# Patient Record
Sex: Female | Born: 1955 | Race: White | Hispanic: No | Marital: Married | Smoking: Never smoker
Health system: Southern US, Community
[De-identification: ages and names within clinical notes are randomized; demographics above are authoritative.]

## PROBLEM LIST (undated history)

## (undated) DIAGNOSIS — K5792 Diverticulitis of intestine, part unspecified, without perforation or abscess without bleeding: Secondary | ICD-10-CM

## (undated) DIAGNOSIS — K589 Irritable bowel syndrome without diarrhea: Secondary | ICD-10-CM

## (undated) DIAGNOSIS — L0591 Pilonidal cyst without abscess: Secondary | ICD-10-CM

## (undated) DIAGNOSIS — E785 Hyperlipidemia, unspecified: Secondary | ICD-10-CM

## (undated) DIAGNOSIS — T7840XA Allergy, unspecified, initial encounter: Secondary | ICD-10-CM

## (undated) DIAGNOSIS — M1990 Unspecified osteoarthritis, unspecified site: Secondary | ICD-10-CM

## (undated) DIAGNOSIS — K8020 Calculus of gallbladder without cholecystitis without obstruction: Secondary | ICD-10-CM

## (undated) DIAGNOSIS — T8859XA Other complications of anesthesia, initial encounter: Secondary | ICD-10-CM

## (undated) DIAGNOSIS — R112 Nausea with vomiting, unspecified: Secondary | ICD-10-CM

## (undated) DIAGNOSIS — T4145XA Adverse effect of unspecified anesthetic, initial encounter: Secondary | ICD-10-CM

## (undated) DIAGNOSIS — N200 Calculus of kidney: Secondary | ICD-10-CM

## (undated) DIAGNOSIS — K5790 Diverticulosis of intestine, part unspecified, without perforation or abscess without bleeding: Secondary | ICD-10-CM

## (undated) DIAGNOSIS — Z87442 Personal history of urinary calculi: Secondary | ICD-10-CM

## (undated) DIAGNOSIS — Z9889 Other specified postprocedural states: Secondary | ICD-10-CM

## (undated) HISTORY — PX: FRACTURE SURGERY: SHX138

## (undated) HISTORY — DX: Diverticulosis of intestine, part unspecified, without perforation or abscess without bleeding: K57.90

## (undated) HISTORY — DX: Allergy, unspecified, initial encounter: T78.40XA

## (undated) HISTORY — PX: APPENDECTOMY: SHX54

## (undated) HISTORY — DX: Unspecified osteoarthritis, unspecified site: M19.90

## (undated) HISTORY — DX: Pilonidal cyst without abscess: L05.91

## (undated) HISTORY — DX: Diverticulitis of intestine, part unspecified, without perforation or abscess without bleeding: K57.92

## (undated) HISTORY — DX: Hyperlipidemia, unspecified: E78.5

## (undated) HISTORY — DX: Calculus of kidney: N20.0

## (undated) HISTORY — DX: Irritable bowel syndrome, unspecified: K58.9

## (undated) HISTORY — DX: Calculus of gallbladder without cholecystitis without obstruction: K80.20

## (undated) HISTORY — PX: ABDOMINAL HYSTERECTOMY: SHX81

---
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
## 2013-05-07 DIAGNOSIS — J3089 Other allergic rhinitis: Secondary | ICD-10-CM | POA: Insufficient documentation

## 2014-01-05 ENCOUNTER — Encounter: Payer: Self-pay | Admitting: Internal Medicine

## 2014-01-29 ENCOUNTER — Encounter: Payer: Self-pay | Admitting: Internal Medicine

## 2014-01-29 ENCOUNTER — Ambulatory Visit (INDEPENDENT_AMBULATORY_CARE_PROVIDER_SITE_OTHER): Payer: Federal, State, Local not specified - PPO | Admitting: Internal Medicine

## 2014-01-29 ENCOUNTER — Other Ambulatory Visit: Payer: Self-pay

## 2014-01-29 ENCOUNTER — Telehealth: Payer: Self-pay | Admitting: Internal Medicine

## 2014-01-29 VITALS — BP 102/68 | HR 68 | Ht 60.0 in | Wt 123.2 lb

## 2014-01-29 DIAGNOSIS — R143 Flatulence: Secondary | ICD-10-CM

## 2014-01-29 DIAGNOSIS — K589 Irritable bowel syndrome without diarrhea: Secondary | ICD-10-CM

## 2014-01-29 DIAGNOSIS — R142 Eructation: Secondary | ICD-10-CM

## 2014-01-29 DIAGNOSIS — K5732 Diverticulitis of large intestine without perforation or abscess without bleeding: Secondary | ICD-10-CM

## 2014-01-29 DIAGNOSIS — K5792 Diverticulitis of intestine, part unspecified, without perforation or abscess without bleeding: Secondary | ICD-10-CM

## 2014-01-29 DIAGNOSIS — R141 Gas pain: Secondary | ICD-10-CM

## 2014-01-29 MED ORDER — PANTOPRAZOLE SODIUM 40 MG PO TBEC: 40.0000 mg | DELAYED_RELEASE_TABLET | Freq: Every day | ORAL | Status: DC

## 2014-01-29 MED ORDER — ALIGN PO CAPS: 1.0000 | ORAL_CAPSULE | Freq: Every day | ORAL | Status: DC

## 2014-01-29 NOTE — Patient Instructions (Addendum)
You may purchase Align over the counter. This puts good bacteria back into your colon. You should take 1 capsule by mouth once daily for 4 weeks.  You may take Miralax as needed for constipation.  We have given you some information on diverticulitis and Irritable Bowel Syndrome  Please follow up with Dr. Henrene Pastor in 3 months

## 2014-01-29 NOTE — Telephone Encounter (Signed)
Spoke with pt and told her I would send rx for Pantoprazole to her pharmacy

## 2014-01-29 NOTE — Progress Notes (Addendum)
HISTORY OF PRESENT ILLNESS:  Stephanie Hoover is a 58 y.o. female self-referred regarding abdominal complaints. The patient lives in [place], has her PCP in [place], but currently works in [place]. She stays with the [facility], patients of mine, during the week. No significant outside records available. Records have been requested. In any event, she tells me that she underwent colonoscopy at age 41. Told that she had diverticulosis. Around that time had a bout of diverticulitis and apparently underwent a second colonoscopy. She denies having had polyps.. The diagnosis of irritable bowel syndrome. In early January 2015 she developed lower abdominal pain which progressed. Apparently evaluated at her local [facility] and was diagnosed with diverticulitis and UTI. Treated with Flagyl and Levaquin. Had problems with epigastric burning discomfort for which she was placed on pantoprazole. Thanks of finally settled down. She does have alternating bowel habits. More problems recently with constipation. Also complains of increased intestinal gas. No bleeding or weight loss.  REVIEW OF SYSTEMS:  All non-GI ROS negative except for back pain, arthritis, sinus and allergy, insomnia  Past Medical History  Diagnosis Date  . Arthritis   . Diverticular disease   . Hyperlipidemia     Past Surgical History  Procedure Laterality Date  . Appendectomy    . Abdominal hysterectomy      Social History Stephanie Hoover  reports that she has never smoked. She has never used smokeless tobacco. She reports that she drinks alcohol. She reports that she does not use illicit drugs.  family history is not on file.  Allergies  Allergen Reactions  . Codeine Nausea And Vomiting  . Demerol [Meperidine] Nausea And Vomiting  . Phenergan Vc [Promethazine-Phenylephrine]        PHYSICAL EXAMINATION: Vital signs: BP 102/68  Pulse 68  Ht 5' (1.524 m)  Wt 123 lb 3.2 oz (55.883 kg)  BMI 24.06 kg/m2  Constitutional:  generally well-appearing, no acute distress Psychiatric: alert and oriented x3, cooperative Eyes: extraocular movements intact, anicteric, conjunctiva pink Mouth: oral pharynx moist, no lesions Neck: supple no lymphadenopathy Cardiovascular: heart regular rate and rhythm, no murmur Lungs: clear to auscultation bilaterally Abdomen: soft, nontender, nondistended, no obvious ascites, no peritoneal signs, normal bowel sounds, no organomegaly Extremities: no lower extremity edema bilaterally Skin: no lesions on visible extremities Neuro: No focal deficits. No asterixis.    ASSESSMENT:  #1. Apparent recent bout of diverticulitis. Resolved after antibiotics #2. IBS #3. Increased intestinal gas #4. Recent UTI #5. Reports of colonoscopy x2 at age 16. Diverticulosis   PLAN:  #1. Probiotic align one daily for 4 weeks #2. MiraLax as needed for constipation #3. Okay to use PPI if this helps burning discomfort. We are happy to refill as needed. #4. Literature on IBS #5. Literature on diverticulosis #6. Literature on increased intestinal gas #7. Obtaining outside records for review. GI followup in 3 months    ADDENDUM (02/01/2014)  Outside records obtained and reviewed. The patient had complete colonoscopy 08/11/2007 to evaluate anorectal pain and chronic diarrhea. Examination revealed sigmoid diverticulosis. A repeat complete colonoscopy was performed 07/09/2008 for left lower cautery pain and abnormal CT scan. Examination again revealed sigmoid diverticulosis. Also, CT scan of the abdomen and pelvis with contrast 12/27/2013 revealed acute diverticulitis versus sigmoid colitis and nonspecific wall thickening with stranding of the duodenum and jejunum. Also, incidental cholelithiasis.

## 2014-02-05 ENCOUNTER — Telehealth: Payer: Self-pay | Admitting: Internal Medicine

## 2014-02-05 NOTE — Telephone Encounter (Signed)
Rec'd from [place] forward 10 pages to Dr.Perry

## 2014-06-08 ENCOUNTER — Ambulatory Visit: Payer: Federal, State, Local not specified - PPO | Admitting: Internal Medicine

## 2014-07-13 ENCOUNTER — Ambulatory Visit (INDEPENDENT_AMBULATORY_CARE_PROVIDER_SITE_OTHER): Payer: Federal, State, Local not specified - PPO | Admitting: Internal Medicine

## 2014-07-13 ENCOUNTER — Encounter: Payer: Self-pay | Admitting: Internal Medicine

## 2014-07-13 ENCOUNTER — Other Ambulatory Visit (INDEPENDENT_AMBULATORY_CARE_PROVIDER_SITE_OTHER): Payer: Federal, State, Local not specified - PPO

## 2014-07-13 VITALS — BP 100/68 | HR 72 | Ht 60.0 in | Wt 129.0 lb

## 2014-07-13 DIAGNOSIS — R300 Dysuria: Secondary | ICD-10-CM

## 2014-07-13 DIAGNOSIS — R5381 Other malaise: Secondary | ICD-10-CM

## 2014-07-13 DIAGNOSIS — K5733 Diverticulitis of large intestine without perforation or abscess with bleeding: Secondary | ICD-10-CM

## 2014-07-13 DIAGNOSIS — R143 Flatulence: Secondary | ICD-10-CM

## 2014-07-13 DIAGNOSIS — K589 Irritable bowel syndrome without diarrhea: Secondary | ICD-10-CM

## 2014-07-13 DIAGNOSIS — R142 Eructation: Secondary | ICD-10-CM

## 2014-07-13 DIAGNOSIS — K5793 Diverticulitis of intestine, part unspecified, without perforation or abscess with bleeding: Secondary | ICD-10-CM

## 2014-07-13 DIAGNOSIS — R5383 Other fatigue: Principal | ICD-10-CM

## 2014-07-13 DIAGNOSIS — R141 Gas pain: Secondary | ICD-10-CM

## 2014-07-13 LAB — URINALYSIS, ROUTINE W REFLEX MICROSCOPIC
Bilirubin Urine: NEGATIVE
Hgb urine dipstick: NEGATIVE
Ketones, ur: NEGATIVE
Leukocytes, UA: NEGATIVE
Nitrite: NEGATIVE
Specific Gravity, Urine: 1.015 (ref 1.000–1.030)
Total Protein, Urine: NEGATIVE
Urine Glucose: NEGATIVE
Urobilinogen, UA: 0.2 (ref 0.0–1.0)
pH: 7 (ref 5.0–8.0)

## 2014-07-13 LAB — CBC WITH DIFFERENTIAL/PLATELET
Basophils Absolute: 0 10*3/uL (ref 0.0–0.1)
Basophils Relative: 0.5 % (ref 0.0–3.0)
Eosinophils Absolute: 0.3 10*3/uL (ref 0.0–0.7)
Eosinophils Relative: 4.3 % (ref 0.0–5.0)
HCT: 40 % (ref 36.0–46.0)
Hemoglobin: 13.4 g/dL (ref 12.0–15.0)
Lymphocytes Relative: 32.7 % (ref 12.0–46.0)
Lymphs Abs: 2.3 10*3/uL (ref 0.7–4.0)
MCHC: 33.4 g/dL (ref 30.0–36.0)
MCV: 94.8 fl (ref 78.0–100.0)
Monocytes Absolute: 0.7 10*3/uL (ref 0.1–1.0)
Monocytes Relative: 9.3 % (ref 3.0–12.0)
Neutro Abs: 3.8 10*3/uL (ref 1.4–7.7)
Neutrophils Relative %: 53.2 % (ref 43.0–77.0)
Platelets: 279 10*3/uL (ref 150.0–400.0)
RBC: 4.22 Mil/uL (ref 3.87–5.11)
RDW: 13.4 % (ref 11.5–15.5)
WBC: 7.1 10*3/uL (ref 4.0–10.5)

## 2014-07-13 LAB — TSH: TSH: 3.26 u[IU]/mL (ref 0.35–4.50)

## 2014-07-13 NOTE — Patient Instructions (Signed)
Your physician has requested that you go to the basement for the following lab work before leaving today:CBC, TSH and Urinalysis.  You have been given a Gas prevention diet.   Try an over the counter probiotic such as Align once daily.

## 2014-07-13 NOTE — Progress Notes (Signed)
HISTORY OF PRESENT ILLNESS:  Stephanie Hoover is a 58 y.o. female initially evaluated 01/29/2014 with abdominal complaints. Please see that dictation. The assessment was recent bout of diverticulitis improved after antibiotics, IBS, increased intestinal gas, and recent UTI. Outside records were requested and subsequently reviewed. The patient had complete colonoscopy in August 2008 and again in July 2009 demonstrating sigmoid diverticulosis. CT scan January 2015 demonstrated sigmoid diverticulitis. Also, incidental cholelithiasis. She was treated with probiotic, MiraLax, and PPI as needed. Education provided. Followup in 3 months recommended. Patient reports that she is doing better overall. No recurrent lower abdominal pain. The difficulties with burning. Now taking PPI once every 2-3 weeks. Occasional MiraLax for constipation. Chief complaint is that of terrible gas. Trying Phazyme. Thanks probiotic may have helped. Other non-GI complaints include fatigue over the past month. 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 Also, intermittent dysuria. She is inbetween PCPs  REVIEW OF SYSTEMS:  All non-GI ROS negative except for fatigue, dysuria, urinary frequency  Past Medical History  Diagnosis Date  . Arthritis   . Diverticular disease   . Hyperlipidemia   . IBS (irritable bowel syndrome)   . Cholelithiasis     Past Surgical History  Procedure Laterality Date  . Appendectomy    . Abdominal hysterectomy      Social History Stephanie Hoover  reports that she has never smoked. She has never used smokeless tobacco. She reports that she drinks alcohol. She reports that she does not use illicit drugs.  family history is not on file.  Allergies  Allergen Reactions  . Codeine Nausea And Vomiting  . Demerol [Meperidine] Nausea And Vomiting  . Phenergan Vc [Promethazine-Phenylephrine]        PHYSICAL EXAMINATION: Vital signs: BP 100/68  Pulse 72  Ht 5' (1.524 m)  Wt 129 lb (58.514 kg)  BMI  25.19 kg/m2 General: Well-developed, well-nourished, no acute distress HEENT: Sclerae are anicteric, conjunctiva pink. Oral mucosa intact Lungs: Clear Heart: Regular Abdomen: soft, nontender, nondistended, no obvious ascites, no peritoneal signs, normal bowel sounds. No organomegaly. Extremities: No edema Psychiatric: alert and oriented x3. Cooperative   ASSESSMENT:  #1. Increased intestinal gas. Ongoing #2. History of diverticulitis. Resolved. Last complete colonoscopy July 2009 #3. Constipation predominant IBS #4. Complaints of fatigue x1 month #5. Dysuria. Previous history of UTI. Rule out the same   PLAN:  #1. Recommend probiotic align #2. Anti-gas and flatulence dietary sheet provider as well as educational brochure on intestinal gas #3. MiraLax as needed for constipation #4. CBC and TSH to screen for conditions but may cause fatigue #5. Urinalysis to rule out UTI #6. Secure PCP ASAP #7. Repeat screening colonoscopy around July 2019. Interval Routine GI followup as needed

## 2015-02-10 ENCOUNTER — Other Ambulatory Visit: Payer: Self-pay | Admitting: Internal Medicine

## 2016-01-19 ENCOUNTER — Ambulatory Visit (INDEPENDENT_AMBULATORY_CARE_PROVIDER_SITE_OTHER): Payer: Federal, State, Local not specified - PPO | Admitting: Internal Medicine

## 2016-01-19 ENCOUNTER — Encounter: Payer: Self-pay | Admitting: Internal Medicine

## 2016-01-19 VITALS — BP 110/70 | HR 66 | Ht 60.0 in | Wt 133.0 lb

## 2016-01-19 DIAGNOSIS — R1084 Generalized abdominal pain: Secondary | ICD-10-CM | POA: Diagnosis not present

## 2016-01-19 DIAGNOSIS — K5909 Other constipation: Secondary | ICD-10-CM | POA: Diagnosis not present

## 2016-01-19 DIAGNOSIS — K582 Mixed irritable bowel syndrome: Secondary | ICD-10-CM

## 2016-01-19 NOTE — Progress Notes (Signed)
HISTORY OF PRESENT ILLNESS:  Stephanie Hoover is a 60 y.o. female with a history of diverticulitis and IBS. She was initially evaluated 01/29/2014. Last evaluation 07/13/2014 (please see that report alluding to outside colonoscopies in 2008 and 2009 as well as CT). At the time of her last visit probiotic was recommended for increased intestinal gas. MiraLAX for constipation. Screening blood work and urinalysis were unremarkable. She presents today with a chief complaint of lower abdominal pain which began around Christmas. The discomfort lasted a few weeks. Mostly midline. She was concerned about diverticulitis but had no fever. No nausea or vomiting. She did seem to be slightly more constipated than usual. MiraLAX does help, though she has some reluctance. She did not try Bentyl. She has had no problems with abdominal discomfort several weeks. She did see her PCP on 01/02/2016. We were able to obtain the outside blood work. CBC and comprehensive metabolic panel were unremarkable as was TSH and urinalysis. The patient describes her bowel movements as tending toward constipation. She continues to live in [place] and work in [place]. She is status post hysterectomy, oophrectomy, and appendectomy  REVIEW OF SYSTEMS:  All non-GI ROS negative except for sinus allergy, fatigue, muscle cramps  Past Medical History  Diagnosis Date  . Arthritis   . Diverticular disease   . Hyperlipidemia   . IBS (irritable bowel syndrome)   . Cholelithiasis   . Diverticulitis   . Cholelithiasis     Past Surgical History  Procedure Laterality Date  . Appendectomy    . Abdominal hysterectomy      Social History Stephanie Hoover  reports that she has never smoked. She has never used smokeless tobacco. She reports that she drinks alcohol. She reports that she does not use illicit drugs.  family history is not on file.  Allergies  Allergen Reactions  . Codeine Nausea And Vomiting  . Demerol [Meperidine] Nausea And  Vomiting  . Phenergan Vc [Promethazine-Phenylephrine]        PHYSICAL EXAMINATION: Vital signs: BP 110/70 mmHg  Pulse 66  Ht 5' (1.524 m)  Wt 133 lb (60.328 kg)  BMI 25.97 kg/m2  Constitutional: generally well-appearing, no acute distress Psychiatric: alert and oriented x3, cooperative Eyes: extraocular movements intact, anicteric, conjunctiva pink Mouth: oral pharynx moist, no lesions Neck: supple no lymphadenopathy Cardiovascular: heart regular rate and rhythm, no murmur Lungs: clear to auscultation bilaterally Abdomen: soft, nontender, nondistended, no obvious ascites, no peritoneal signs, normal bowel sounds, no organomegaly Rectal: Omitted Extremities: no clubbing cyanosis or lower extremity edema bilaterally Skin: no lesions on visible extremities Neuro: No focal deficits. Normal deep tendon reflexes   ASSESSMENT:  #1. Recent problems with lower abdominal pain as described. This may have been low-grade diverticulitis which is self limited. Alternatively, diverticular spasm, IBS, or problems with relative constipation. Again, asymptomatic now for several weeks.   PLAN:  #1. Recommended daily fiber supplementation with Metamucil #2. Recommended she try Bentyl as needed for pain should it recur #3. Told to seek medical attention if she were develop persistent pain that worsened or was accompanied by fever as this may represent recurrent diverticulitis #4. Routine screening colonoscopy around 2019 #5. Annual GI follow-up in 1 year. Sooner if needed #6. Advised that she could use MiraLAX on an as-needed basis. If regular use helpful, then okay  25 minutes was spent face-to-face with the patient. Greater than 50% of the time use for counseling regarding her GI complaints and reviewing in detail management as outlined

## 2016-01-19 NOTE — Patient Instructions (Signed)
Please follow up as needed 

## 2018-12-17 HISTORY — PX: PARTIAL COLECTOMY: SHX5273

## 2018-12-24 ENCOUNTER — Encounter (INDEPENDENT_AMBULATORY_CARE_PROVIDER_SITE_OTHER): Payer: Self-pay

## 2018-12-24 ENCOUNTER — Encounter: Payer: Self-pay | Admitting: Physician Assistant

## 2018-12-24 ENCOUNTER — Other Ambulatory Visit (INDEPENDENT_AMBULATORY_CARE_PROVIDER_SITE_OTHER): Payer: Federal, State, Local not specified - PPO

## 2018-12-24 ENCOUNTER — Ambulatory Visit: Payer: Federal, State, Local not specified - PPO | Admitting: Physician Assistant

## 2018-12-24 ENCOUNTER — Other Ambulatory Visit: Payer: Self-pay

## 2018-12-24 ENCOUNTER — Ambulatory Visit (INDEPENDENT_AMBULATORY_CARE_PROVIDER_SITE_OTHER)
Admission: RE | Admit: 2018-12-24 | Discharge: 2018-12-24 | Disposition: A | Discharge status: Home / Self Care | Payer: Federal, State, Local not specified - PPO | Source: Ambulatory Visit | Attending: Physician Assistant | Admitting: Physician Assistant

## 2018-12-24 VITALS — BP 102/62 | HR 96 | Ht 60.0 in | Wt 123.8 lb

## 2018-12-24 DIAGNOSIS — R197 Diarrhea, unspecified: Secondary | ICD-10-CM

## 2018-12-24 DIAGNOSIS — R1032 Left lower quadrant pain: Secondary | ICD-10-CM

## 2018-12-24 LAB — BASIC METABOLIC PANEL
BUN: 9 mg/dL (ref 6–23)
CO2: 30 mEq/L (ref 19–32)
Calcium: 9.4 mg/dL (ref 8.4–10.5)
Chloride: 101 mEq/L (ref 96–112)
Creatinine, Ser: 0.79 mg/dL (ref 0.40–1.20)
GFR: 78.46 mL/min (ref >60.00)
Glucose, Bld: 89 mg/dL (ref 70–99)
Potassium: 4.3 mEq/L (ref 3.5–5.1)
Sodium: 137 mEq/L (ref 135–145)

## 2018-12-24 MED ORDER — HYOSCYAMINE SULFATE 0.125 MG SL SUBL: 0.1250 mg | SUBLINGUAL_TABLET | Freq: Four times a day (QID) | SUBLINGUAL | 2 refills | Status: DC

## 2018-12-24 MED ORDER — IOPAMIDOL (ISOVUE-300) INJECTION 61%
100.0000 mL | Freq: Once | INTRAVENOUS | Status: AC | PRN
  Administered 2018-12-24: 100 mL via INTRAVENOUS

## 2018-12-24 NOTE — Progress Notes (Signed)
Chief Complaint: LLQ pain and diarrhea  HPI:    Stephanie Hoover is a 63 y/o female with a past medical history as below, known to Dr. Henrene Pastor, who presents to clinic today with a complaint of abdominal pain and diarrhea.    07/09/2008 colonoscopy in [place] with diverticulosis and otherwise normal.    Today, the patient explains that back at the end of October she started with a left lower quadrant pain which seemed to radiate across her abdomen and saw her PCP who started Augmentin x7 days for suspected diverticulitis.  After starting antibiotics patient had terrible diarrhea, but was able to finish a 7-day course.  She was then okay for about a month or so but around December 17 she started again with left lower quadrant pain, again given Augmentin x10 days by her PCP.  Started with diarrhea again, used Imodium for 4 days and the diarrhea seemed to improve but her pain never fully went away, she did not finish her full course of abx.  On 12/27 she again felt bad and again tried to finish her leftover antibiotics, but continued with pain.  12/28 she started having more severe pain and a low-grade fever of 99.5, by 12/30 this had eased off but she went to see her PCP and again they prescribed her Cipro and Flagyl.  She started with 10-12 liquid green stools per day and discontinued these antibiotics Friday 12/19/18.  Has been using probiotics since then, pain was somewhat better until 12/22/2018 when it came back again.  She tried to go to work on Tuesday but had to leave at noon due to this pain.  Used an Aleve and a heating pad and eventually fell asleep and when she awoke her pain was somewhat better,. This morning pain is only a 2/10 but this is the best it has felt over the past couple of months.  Her diarrhea has gotten better over the past few days.    Denies weight loss, anorexia, nausea, vomiting, heartburn, reflux or blood in her stool.  Past Medical History:  Diagnosis Date  . Arthritis   .  Cholelithiasis   . Diverticular disease   . Diverticulitis   . Hyperlipidemia   . IBS (irritable bowel syndrome)   . Kidney stone     Past Surgical History:  Procedure Laterality Date  . ABDOMINAL HYSTERECTOMY    . APPENDECTOMY      Current Outpatient Medications  Medication Sig Dispense Refill  . dicyclomine (BENTYL) 10 MG capsule Take 10 mg by mouth 4 (four) times daily -  before meals and at bedtime. As needed    . estradiol (ESTRACE) 0.1 MG/GM vaginal cream Place 1 Applicatorful vaginally as directed.    Marland Kitchen ESTRADIOL PO Take by mouth as directed.    . fluticasone (FLONASE) 50 MCG/ACT nasal spray Place 1 spray into both nostrils daily.    Marland Kitchen loratadine (CLARITIN) 10 MG tablet Take 10 mg by mouth daily.     No current facility-administered medications for this visit.     Allergies as of 12/24/2018 - Review Complete 12/24/2018  Allergen Reaction Noted  . Codeine Nausea And Vomiting 01/29/2014  . Demerol [meperidine] Nausea And Vomiting 01/29/2014  . Phenergan vc [promethazine-phenylephrine]  01/29/2014    Family History  Problem Relation Age of Onset  . Gallbladder disease Sister   . Esophageal cancer Neg Hx   . Colon cancer Neg Hx   . Pancreatic cancer Neg Hx   . Liver disease Neg  Hx     Social History   Socioeconomic History  . Marital status: Married    Spouse name: Not on file  . Number of children: 2  . Years of education: Not on file  . Highest education level: Not on file  Occupational History  . Occupation: Tour manager  Social Needs  . Financial resource strain: Not on file  . Food insecurity:    Worry: Not on file    Inability: Not on file  . Transportation needs:    Medical: Not on file    Non-medical: Not on file  Tobacco Use  . Smoking status: Never Smoker  . Smokeless tobacco: Never Used  Substance and Sexual Activity  . Alcohol use: Yes    Comment: Wine- 1 glass nightly  . Drug use: No  . Sexual activity: Not on file  Lifestyle  .  Physical activity:    Days per week: Not on file    Minutes per session: Not on file  . Stress: Not on file  Relationships  . Social connections:    Talks on phone: Not on file    Gets together: Not on file    Attends religious service: Not on file    Active member of club or organization: Not on file    Attends meetings of clubs or organizations: Not on file    Relationship status: Not on file  . Intimate partner violence:    Fear of current or ex partner: Not on file    Emotionally abused: Not on file    Physically abused: Not on file    Forced sexual activity: Not on file  Other Topics Concern  . Not on file  Social History Narrative  . Not on file    Review of Systems:    Constitutional: No weight loss Skin: No rash Cardiovascular: No chest pain  Respiratory: No SOB  Gastrointestinal: See HPI and otherwise negative Genitourinary: No dysuria  Neurological: No headache, dizziness or syncope Musculoskeletal: No new muscle or joint pain Hematologic: No bleeding  Psychiatric: No history of depression or anxiety   Physical Exam:  Vital signs: BP 102/62   Pulse 96   Ht 5' (1.524 m)   Wt 123 lb 12.8 oz (56.2 kg)   BMI 24.18 kg/m   Constitutional:   Pleasant Caucasian female appears to be in NAD, Well developed, Well nourished, alert and cooperative Head:  Normocephalic and atraumatic. Eyes:   PEERL, EOMI. No icterus. Conjunctiva pink. Ears:  Normal auditory acuity. Neck:  Supple Throat: Oral cavity and pharynx without inflammation, swelling or lesion.  Respiratory: Respirations even and unlabored. Lungs clear to auscultation bilaterally.   No wheezes, crackles, or rhonchi.  Cardiovascular: Normal S1, S2. No MRG. Regular rate and rhythm. No peripheral edema, cyanosis or pallor.  Gastrointestinal:  Soft, nondistended, Mild LLQ ttp. No rebound or guarding. Normal bowel sounds. No appreciable masses or hepatomegaly. Rectal:  Not performed.  Msk:  Symmetrical without gross  deformities. Without edema, no deformity or joint abnormality.  Neurologic:  Alert and  oriented x4;  grossly normal neurologically.  Skin:   Dry and intact without significant lesions or rashes. Psychiatric: Demonstrates good judgement and reason without abnormal affect or behaviors.  RELEVANT LABS AND IMAGING: CBC    Component Value Date/Time   WBC 7.1 07/13/2014 1604   RBC 4.22 07/13/2014 1604   HGB 13.4 07/13/2014 1604   HCT 40.0 07/13/2014 1604   PLT 279.0 07/13/2014 1604   MCV 94.8  07/13/2014 1604   MCHC 33.4 07/13/2014 1604   RDW 13.4 07/13/2014 1604   LYMPHSABS 2.3 07/13/2014 1604   MONOABS 0.7 07/13/2014 1604   EOSABS 0.3 07/13/2014 1604   BASOSABS 0.0 07/13/2014 1604   Assessment: 1.  Left lower quadrant abdominal pain: Off-and-on for the past 3 months, status post 3 rounds of antibiotics, continues now, known diverticulosis on last colonoscopy in 2009; consider diverticulitis with complication versus other 2.  Diarrhea: After antibiotics above  Plan: 1.  Ordered CT abdomen pelvis with contrast to be done within the next 1 to 2 days for the patient. 2.  We will request recent labs from PCP on Tuesday of this past week 3.  Ordered Hyoscyamine 0.125 mg every 6 hours as needed for abdominal pain #30 with 1 refill 4.  Patient to let us know if diarrhea returns. 5.  Wrote patient a work note for the rest of this week. 6.  If CT is normal would recommend a colonoscopy, patient is overdue for screening as well 7.  Patient to follow in clinic per recommendations after CT above.  Ellouise Newer, PA-C [facility] 12/24/2018, 10:49 AM

## 2018-12-24 NOTE — Patient Instructions (Signed)
Your provider has requested that you go to the basement level for lab work before leaving today. Press "B" on the elevator. The lab is located at the first door on the left as you exit the elevator.  We have sent the following medications to your pharmacy for you to pick up at your convenience: Hyocyamine  You have been scheduled for a CT scan of the abdomen and pelvis at [facility] ([address]---this is in the same building as [facility]).   You are scheduled on 12/26/2018 at 2:45pm. You should arrive 15 minutes prior to your appointment time for registration. Please follow the written instructions below on the day of your exam:  WARNING: IF YOU ARE ALLERGIC TO IODINE/X-RAY DYE, PLEASE NOTIFY RADIOLOGY IMMEDIATELY AT [phone]! YOU WILL BE GIVEN A 13 HOUR PREMEDICATION PREP.  1) Do not drink anything after 10:45am (4 hours prior to your test) 2) You have been given 2 bottles of oral contrast to drink. The solution may taste better if refrigerated, but do NOT add ice or any other liquid to this solution. Shake well before drinking.    Drink 1 bottle of contrast @ 12:45pm (2 hours prior to your exam)  Drink 1 bottle of contrast @ 1:45pm (1 hour prior to your exam)  You may take any medications as prescribed with a small amount of water, if necessary. If you take any of the following medications: METFORMIN, GLUCOPHAGE, GLUCOVANCE, AVANDAMET, RIOMET, FORTAMET, [place] MET, JANUMET, GLUMETZA or METAGLIP, you MAY be asked to HOLD this medication 48 hours AFTER the exam.  The purpose of you drinking the oral contrast is to aid in the visualization of your intestinal tract. The contrast solution may cause some diarrhea. Depending on your individual set of symptoms, you may also receive an intravenous injection of x-ray contrast/dye. Plan on being at [facility] for 30 minutes or longer, depending on the type of exam you are having performed.  This test typically  takes 30-45 minutes to complete.  If you have any questions regarding your exam or if you need to reschedule, you may call the CT department at [phone] between the hours of 8:00 am and 5:00 pm, Monday-Friday.  ________________________________________________________________________

## 2018-12-24 NOTE — Progress Notes (Signed)
Assessment and plan reviewed 

## 2018-12-25 ENCOUNTER — Telehealth: Payer: Self-pay | Admitting: Physician Assistant

## 2018-12-25 ENCOUNTER — Inpatient Hospital Stay (HOSPITAL_COMMUNITY)
Admission: AD | Admit: 2018-12-25 | Discharge: 2018-12-29 | DRG: 392 | Disposition: A | Discharge status: Home / Self Care | Payer: Federal, State, Local not specified - PPO | Attending: Internal Medicine | Admitting: Internal Medicine

## 2018-12-25 ENCOUNTER — Other Ambulatory Visit: Payer: Self-pay

## 2018-12-25 DIAGNOSIS — K589 Irritable bowel syndrome without diarrhea: Secondary | ICD-10-CM | POA: Diagnosis present

## 2018-12-25 DIAGNOSIS — M1990 Unspecified osteoarthritis, unspecified site: Secondary | ICD-10-CM | POA: Diagnosis present

## 2018-12-25 DIAGNOSIS — Z885 Allergy status to narcotic agent status: Secondary | ICD-10-CM | POA: Diagnosis not present

## 2018-12-25 DIAGNOSIS — K8020 Calculus of gallbladder without cholecystitis without obstruction: Secondary | ICD-10-CM | POA: Diagnosis present

## 2018-12-25 DIAGNOSIS — K5720 Diverticulitis of large intestine with perforation and abscess without bleeding: Secondary | ICD-10-CM | POA: Diagnosis present

## 2018-12-25 DIAGNOSIS — E785 Hyperlipidemia, unspecified: Secondary | ICD-10-CM | POA: Diagnosis present

## 2018-12-25 DIAGNOSIS — Z882 Allergy status to sulfonamides status: Secondary | ICD-10-CM | POA: Diagnosis not present

## 2018-12-25 DIAGNOSIS — Z79899 Other long term (current) drug therapy: Secondary | ICD-10-CM

## 2018-12-25 DIAGNOSIS — Z87442 Personal history of urinary calculi: Secondary | ICD-10-CM | POA: Diagnosis not present

## 2018-12-25 DIAGNOSIS — Z888 Allergy status to other drugs, medicaments and biological substances status: Secondary | ICD-10-CM | POA: Diagnosis not present

## 2018-12-25 LAB — CBC WITH DIFFERENTIAL/PLATELET
Abs Immature Granulocytes: 0.06 10*3/uL (ref 0.00–0.07)
Basophils Absolute: 0.1 10*3/uL (ref 0.0–0.1)
Basophils Relative: 1 %
Eosinophils Absolute: 0.3 10*3/uL (ref 0.0–0.5)
Eosinophils Relative: 3 %
HCT: 38.3 % (ref 36.0–46.0)
Hemoglobin: 12.3 g/dL (ref 12.0–15.0)
Immature Granulocytes: 1 %
Lymphocytes Relative: 20 %
Lymphs Abs: 1.8 10*3/uL (ref 0.7–4.0)
MCH: 31.9 pg (ref 26.0–34.0)
MCHC: 32.1 g/dL (ref 30.0–36.0)
MCV: 99.2 fL (ref 80.0–100.0)
Monocytes Absolute: 0.7 10*3/uL (ref 0.1–1.0)
Monocytes Relative: 7 %
Neutro Abs: 6.3 10*3/uL (ref 1.7–7.7)
Neutrophils Relative %: 68 %
Platelets: 381 10*3/uL (ref 150–400)
RBC: 3.86 MIL/uL — ABNORMAL LOW (ref 3.87–5.11)
RDW: 12.3 % (ref 11.5–15.5)
WBC: 9.1 10*3/uL (ref 4.0–10.5)
nRBC: 0 % (ref 0.0–0.2)

## 2018-12-25 MED ORDER — PIPERACILLIN-TAZOBACTAM 3.375 G IVPB
3.3750 g | Freq: Three times a day (TID) | INTRAVENOUS | Status: DC
  Administered 2018-12-25 – 2018-12-29 (×11): 3.375 g via INTRAVENOUS
  Filled 2018-12-25 (×13): qty 50

## 2018-12-25 MED ORDER — ONDANSETRON HCL 4 MG/2ML IJ SOLN: 4.0000 mg | Freq: Four times a day (QID) | INTRAMUSCULAR | Status: DC | PRN

## 2018-12-25 MED ORDER — ONDANSETRON HCL 4 MG PO TABS: 4.0000 mg | ORAL_TABLET | Freq: Four times a day (QID) | ORAL | Status: DC | PRN

## 2018-12-25 MED ORDER — SODIUM CHLORIDE 0.9 % IV SOLN
INTRAVENOUS | Status: DC
  Administered 2018-12-25 – 2018-12-28 (×5): via INTRAVENOUS

## 2018-12-25 MED ORDER — ENOXAPARIN SODIUM 40 MG/0.4ML ~~LOC~~ SOLN
40.0000 mg | SUBCUTANEOUS | Status: DC
  Filled 2018-12-25: qty 0.4

## 2018-12-25 MED ORDER — ACETAMINOPHEN 325 MG PO TABS
650.0000 mg | ORAL_TABLET | Freq: Four times a day (QID) | ORAL | Status: DC | PRN
  Administered 2018-12-26: 650 mg via ORAL
  Filled 2018-12-25: qty 2

## 2018-12-25 MED ORDER — ACETAMINOPHEN 650 MG RE SUPP: 650.0000 mg | Freq: Four times a day (QID) | RECTAL | Status: DC | PRN

## 2018-12-25 MED ORDER — MORPHINE SULFATE (PF) 2 MG/ML IV SOLN: 2.0000 mg | INTRAVENOUS | Status: DC | PRN

## 2018-12-25 NOTE — Telephone Encounter (Signed)
333 12/25/2018  Spoke with hospitalist Dr. Foy Guadalajara who has agreed to admit the patient for recurrent diverticulitis and CT showing abscess.  Patient is awaiting a bed at [facility].  Patient did ask questions in regard to her insurance.  Assured her that if [facility] covers the [facility] system she is good.  Patient was advised to wait for a call from bed placement.  She will then proceed to the hospital.  Explained that our team may not see her tonight but will be checking on her tomorrow.  Spoke with Alonza Bogus, our PA on call over at the hospital to let her know that patient will be there tomorrow.  Patient will need to be started on IV antibiotics.  Ellouise Newer, PA-C

## 2018-12-25 NOTE — Progress Notes (Signed)
This is a 63 year old white female sent over as direct admission from [facility] gastroenterology the care of Dr. Carlean Purl.  Patient has recurrent diverticulitis by CT with abscess and failed outpatient treatment x3.Marland Kitchen  She has failed Augmentin x2 as well as Cipro and Flagyl as 1 as an outpatient.  Being sent over for IV antibiotics and gastroenterology consultation.

## 2018-12-25 NOTE — Telephone Encounter (Signed)
1203 12/25/2018  Called patient in regards to recent CT showing intra-diverticular abscess.  Discussed case with Dr. Henrene Pastor who recommends patient proceed to the hospital for admission and IV antibiotics.  Called the patient but she did not answer, left message on machine to return phone call to myself or my nurse.  If patient calls while I am gone, please review CT with her, recommendations are for her to proceed to the hospital.  If she is willing to do to do so, we will need to try and call hospitalist and get her admitted and get a bed, etc.  Please let me know.  Thank you, Ellouise Newer, PA-C

## 2018-12-25 NOTE — Progress Notes (Signed)
Pharmacy Antibiotic Note  Justin Buechner is a 63 y.o. female admitted on 12/25/2018 with intra-abdominal infection.  Pharmacy has been consulted for zosyn dosing.  Plan: Zosyn 3.375g IV Q8H infused over 4hrs. Pharmacy will sign off and follow peripherally    No data recorded.  Recent Labs  Lab 12/24/18 1137  CREATININE 0.79    Estimated Creatinine Clearance: 58.1 mL/min (by C-G formula based on SCr of 0.79 mg/dL).    Allergies  Allergen Reactions  . Codeine Nausea And Vomiting  . Morphine Nausea And Vomiting  . Phenergan Vc [Promethazine-Phenylephrine] Nausea And Vomiting  . Promethazine     Other reaction(s): Muscle Pain Muscle twitching  . Meperidine Nausea And Vomiting  . Sulfadiazine Rash     Thank you for allowing pharmacy to be a part of this patient's care.  Dolly Rias RPh 12/25/2018, 8:59 PM Pager [phone]

## 2018-12-25 NOTE — H&P (Signed)
History and Physical    Burnett Lieber FYB:[id number] DOB: 12-Jun-1956 DOA: 12/25/2018  PCP: System, Pcp Not In  Patient coming from: Home  I have personally briefly reviewed patient's old medical records in [place]  Chief Complaint: Diverticulitis  HPI: Stephanie Hoover is a 63 y.o. female with medical history significant of diverticulitis.  Patient has been having recurrent episodes of diverticulitis since around the end of October.  At that time she started with a left lower quadrant pain which seemed to radiate across her abdomen and saw her PCP who started Augmentin x7 days for suspected diverticulitis.  After starting antibiotics patient had terrible diarrhea, but was able to finish a 7-day course.  She was then okay for about a month or so but around December 17 she started again with left lower quadrant pain, again given Augmentin x10 days by her PCP.  Started with diarrhea again, used Imodium for 4 days and the diarrhea seemed to improve but her pain never fully went away, she did not finish her full course of abx.  On 12/27 she again felt bad and again tried to finish her leftover antibiotics, but continued with pain.  12/28 she started having more severe pain and a low-grade fever of 99.5, by 12/30 this had eased off but she went to see her PCP and again they prescribed her Cipro and Flagyl.  She started with 10-12 liquid green stools per day and discontinued these antibiotics Friday 12/19/18.  Has been using probiotics since then, pain was somewhat better until 12/22/2018 when it came back again.  She tried to go to work on Tuesday but had to leave at noon due to this pain.  Used an Aleve and a heating pad and eventually fell asleep and when she awoke her pain was somewhat better,.  Her diarrhea had resolved over the past couple of days.  Patient underwent CT abd / pelvis yesterday which showed diverticulitis with intra diverticular abscess.  She was sent in to the hospital as a direct admit  today.    Review of Systems: As per HPI otherwise 10 point review of systems negative.   Past Medical History:  Diagnosis Date  . Arthritis   . Cholelithiasis   . Diverticular disease   . Diverticulitis   . Hyperlipidemia   . IBS (irritable bowel syndrome)   . Kidney stone     Past Surgical History:  Procedure Laterality Date  . ABDOMINAL HYSTERECTOMY    . APPENDECTOMY       reports that she has never smoked. She has never used smokeless tobacco. She reports current alcohol use. She reports that she does not use drugs.  Allergies  Allergen Reactions  . Codeine Nausea And Vomiting  . Morphine Nausea And Vomiting  . Phenergan Vc [Promethazine-Phenylephrine] Nausea And Vomiting  . Promethazine     Other reaction(s): Muscle Pain Muscle twitching  . Meperidine Nausea And Vomiting  . Sulfadiazine Rash    Family History  Problem Relation Age of Onset  . Gallbladder disease Sister   . Esophageal cancer Neg Hx   . Colon cancer Neg Hx   . Pancreatic cancer Neg Hx   . Liver disease Neg Hx      Prior to Admission medications   Medication Sig Start Date End Date Taking? Authorizing Provider  estradiol (ESTRACE) 0.5 MG tablet Take 0.5 mg by mouth daily.   Yes [provider]  fluticasone (FLONASE) 50 MCG/ACT nasal spray Place 1 spray into both nostrils  daily.   Yes [provider]  hyoscyamine (LEVSIN SL) 0.125 MG SL tablet Place 1 tablet (0.125 mg total) under the tongue every 6 (six) hours. 12/24/18  Yes Levin Erp, PA  loratadine (CLARITIN) 10 MG tablet Take 10 mg by mouth daily.   Yes [provider]    Physical Exam: Vitals:   12/25/18 2139  BP: 114/69  Pulse: 80  Temp: 97.8 F (36.6 C)  TempSrc: Oral  SpO2: 98%    Constitutional: NAD, calm, comfortable Eyes: PERRL, lids and conjunctivae normal ENMT: Mucous membranes are moist. Posterior pharynx clear of any exudate or lesions.Normal dentition.  Neck: normal, supple, no  masses, no thyromegaly Respiratory: clear to auscultation bilaterally, no wheezing, no crackles. Normal respiratory effort. No accessory muscle use.  Cardiovascular: Regular rate and rhythm, no murmurs / rubs / gallops. No extremity edema. 2+ pedal pulses. No carotid bruits.  Abdomen: Mild TTP LLQ Musculoskeletal: no clubbing / cyanosis. No joint deformity upper and lower extremities. Good ROM, no contractures. Normal muscle tone.  Skin: no rashes, lesions, ulcers. No induration Neurologic: CN 2-12 grossly intact. Sensation intact, DTR normal. Strength 5/5 in all 4.  Psychiatric: Normal judgment and insight. Alert and oriented x 3. Normal mood.    Labs on Admission: I have personally reviewed following labs and imaging studies  CBC: Recent Labs  Lab 12/25/18 2117  WBC 9.1  NEUTROABS 6.3  HGB 12.3  HCT 38.3  MCV 99.2  PLT 557   Basic Metabolic Panel: Recent Labs  Lab 12/24/18 1137  NA 137  K 4.3  CL 101  CO2 30  GLUCOSE 89  BUN 9  CREATININE 0.79  CALCIUM 9.4   GFR: Estimated Creatinine Clearance: 58.1 mL/min (by C-G formula based on SCr of 0.79 mg/dL). Liver Function Tests: No results for input(s): AST, ALT, ALKPHOS, BILITOT, PROT, ALBUMIN in the last 168 hours. No results for input(s): LIPASE, AMYLASE in the last 168 hours. No results for input(s): AMMONIA in the last 168 hours. Coagulation Profile: No results for input(s): INR, PROTIME in the last 168 hours. Cardiac Enzymes: No results for input(s): CKTOTAL, CKMB, CKMBINDEX, TROPONINI in the last 168 hours. BNP (last 3 results) No results for input(s): PROBNP in the last 8760 hours. HbA1C: No results for input(s): HGBA1C in the last 72 hours. CBG: No results for input(s): GLUCAP in the last 168 hours. Lipid Profile: No results for input(s): CHOL, HDL, LDLCALC, TRIG, CHOLHDL, LDLDIRECT in the last 72 hours. Thyroid Function Tests: No results for input(s): TSH, T4TOTAL, FREET4, T3FREE, THYROIDAB in the last 72  hours. Anemia Panel: No results for input(s): VITAMINB12, FOLATE, FERRITIN, TIBC, IRON, RETICCTPCT in the last 72 hours. Urine analysis:    Component Value Date/Time   COLORURINE YELLOW 07/13/2014 [place] 07/13/2014 1604   LABSPEC 1.015 07/13/2014 1604   PHURINE 7.0 07/13/2014 1604   GLUCOSEU NEGATIVE 07/13/2014 1604   HGBUR NEGATIVE 07/13/2014 1604   BILIRUBINUR NEGATIVE 07/13/2014 1604   KETONESUR NEGATIVE 07/13/2014 1604   UROBILINOGEN 0.2 07/13/2014 1604   NITRITE NEGATIVE 07/13/2014 1604   LEUKOCYTESUR NEGATIVE 07/13/2014 1604    Radiological Exams on Admission: Ct Abdomen Pelvis W Contrast  Result Date: 12/25/2018 CLINICAL DATA:  Intermittent left lower quadrant abdominal pain for the past 3 months. EXAM: CT ABDOMEN AND PELVIS WITH CONTRAST TECHNIQUE: Multidetector CT imaging of the abdomen and pelvis was performed using the standard protocol following bolus administration of intravenous contrast. CONTRAST:  148mL ISOVUE-300 IOPAMIDOL (ISOVUE-300) INJECTION  61% COMPARISON:  None. FINDINGS: Lower chest: No acute abnormality. Hepatobiliary: No focal liver abnormality. Cholelithiasis. No gallbladder wall thickening or biliary dilatation. Pancreas: Unremarkable. No pancreatic ductal dilatation or surrounding inflammatory changes. Spleen: Normal in size without focal abnormality. Adrenals/Urinary Tract: Adrenal glands are unremarkable. Kidneys are normal, without renal calculi, focal lesion, or hydronephrosis. Bladder is unremarkable. Stomach/Bowel: The stomach is within normal limits. Sigmoid diverticulosis with focal irregular wall thickening of the mid sigmoid colon with mild adjacent pericolonic fat stranding. The inflammatory changes are centered on a rim enhancing, fluid containing diverticulum measuring 1.4 x 1.7 x 1.3 cm. The small bowel is unremarkable. Vascular/Lymphatic: Mild aortic atherosclerosis. No enlarged abdominal or pelvic lymph nodes. Reproductive: Status  post hysterectomy. No adnexal masses. Other: No free fluid or pneumoperitoneum.  No fluid collection. Musculoskeletal: No acute or significant osseous findings. IMPRESSION: 1. Focal irregular wall thickening of the mid sigmoid colon with adjacent pericolonic inflammatory changes centered on a rim enhancing, fluid containing diverticulum measuring 1.7 cm. Findings consistent with diverticulitis with small intra-diverticular abscess. Given the relatively focal irregular wall thickening, in the non-urgent setting following complete resolution of the patient's symptoms, colonoscopy is recommended to exclude the possibility of an underlying neoplastic lesion. 2. Cholelithiasis. Electronically Signed   By: Titus Dubin M.D.   On: 12/25/2018 08:38    EKG: Independently reviewed.  Assessment/Plan Principal Problem:   Diverticulitis of large intestine with abscess    1. Diverticulitis with abscess of sigmoid colon - 1. Zosyn 2. IVF 3. Clear liquid diet only 4. Morphine PRN pain 5. Call general surgery for consult in AM  DVT prophylaxis: Lovenox Code Status: Full Family Communication: No family in room Disposition Plan: Home after admit Consults called: None Admission status: Admit to inpatient - failed outpatient therapy  Severity of Illness: The appropriate patient status for this patient is INPATIENT. Inpatient status is judged to be reasonable and necessary in order to provide the required intensity of service to ensure the patient's safety. The patient's presenting symptoms, physical exam findings, and initial radiographic and laboratory data in the context of their chronic comorbidities is felt to place them at high risk for further clinical deterioration. Furthermore, it is not anticipated that the patient will be medically stable for discharge from the hospital within 2 midnights of admission. The following factors support the patient status of inpatient.   " The patient's presenting  symptoms include Abd pain, recurrent. " The worrisome physical exam findings include LLQ abd TTP. " The initial radiographic and laboratory data are worrisome because of diverticulitis with abscess.   * I certify that at the point of admission it is my clinical judgment that the patient will require inpatient hospital care spanning beyond 2 midnights from the point of admission due to high intensity of service, high risk for further deterioration and high frequency of surveillance required.Etta Quill DO [facility] Pager [phone] Only works nights!  If 7AM-7PM, please contact the primary day team physician taking care of patient  [email or web address] Password [facility]  12/25/2018, 9:49 PM

## 2018-12-26 ENCOUNTER — Other Ambulatory Visit: Payer: Federal, State, Local not specified - PPO

## 2018-12-26 ENCOUNTER — Encounter (HOSPITAL_COMMUNITY): Payer: Self-pay | Admitting: General Surgery

## 2018-12-26 DIAGNOSIS — K5720 Diverticulitis of large intestine with perforation and abscess without bleeding: Principal | ICD-10-CM

## 2018-12-26 LAB — HIV ANTIBODY (ROUTINE TESTING W REFLEX): HIV Screen 4th Generation wRfx: NONREACTIVE

## 2018-12-26 MED ORDER — SODIUM CHLORIDE 0.9 % IV SOLN: INTRAVENOUS | Status: DC | PRN

## 2018-12-26 MED ORDER — SACCHAROMYCES BOULARDII 250 MG PO CAPS
250.0000 mg | ORAL_CAPSULE | Freq: Two times a day (BID) | ORAL | Status: DC
  Administered 2018-12-26 – 2018-12-29 (×6): 250 mg via ORAL
  Filled 2018-12-26 (×6): qty 1

## 2018-12-26 NOTE — Consult Note (Signed)
Stephanie Hoover Dec 07, 1956  [id number].    Requesting MD: Dr. Florencia Reasons Chief Complaint/Reason for Consult: diverticulitis   HPI:  This is a pleasant otherwise healthy 63 yo white female who began having some LLQ abdominal pain in October of 2019.  She saw her PCP who prescribed her some Augmentin.  She developed diarrhea from her augmentin and ultimately started feeling better and terminated her abx course prior to the 10 days prescribed to her.  She felt better, but still had some intermittent symptoms over the next couple of months, but nothing that warranted a repeat follow up.  However, in early December this changed and she developed recurrent LLQ abdominal pain.  She was given another course of abx therapy with augmentin again and once again developed diarrhea.  She states she did complete all 10 days that time.  Her pain returned again later in December and she was started on Cipro/Flagyl this time.  She then began having copious bilious diarrhea.  No c diff has been checked.  She terminated this course after about 5 days.  All labs in the PCP office were normal at each of these visits.  She was finally referred to GI and she saw one of the PAs on Wednesday.  She was referred for a CT scan which revealed diverticulitis and what appears to be a "rim enhancing, fluid containing diverticulum measuring 1.7 cm. Findings consistent with diverticulitis with small intra-diverticular abscess."  There was also some focal irregular wall thickening and a colonoscopy was recommended in the outpatient setting.  Her last colonoscopy appears to have been in 2009 showing some diverticulosis but no active disease.  Given her failed outpatient management and CT findings, she was direct admitted and placed on zosyn.  Her WBC is normal.  We have been asked to see her for further evaluation and recommendations.  ROS: ROS : Please see HPI, otherwise all other systems have been reviewed and are negative.  Family  History  Problem Relation Age of Onset  . Gallbladder disease Sister   . Esophageal cancer Neg Hx   . Colon cancer Neg Hx   . Pancreatic cancer Neg Hx   . Liver disease Neg Hx     Past Medical History:  Diagnosis Date  . Arthritis   . Cholelithiasis   . Diverticular disease   . Diverticulitis   . Hyperlipidemia   . IBS (irritable bowel syndrome)   . Kidney stone     Past Surgical History:  Procedure Laterality Date  . ABDOMINAL HYSTERECTOMY    . APPENDECTOMY      Social History:  reports that she has never smoked. She has never used smokeless tobacco. She reports current alcohol use. She reports that she does not use drugs.  Allergies:  Allergies  Allergen Reactions  . Codeine Nausea And Vomiting  . Morphine Nausea And Vomiting  . Phenergan Vc [Promethazine-Phenylephrine] Nausea And Vomiting  . Promethazine     Other reaction(s): Muscle Pain Muscle twitching  . Meperidine Nausea And Vomiting  . Sulfadiazine Rash    Medications Prior to Admission  Medication Sig Dispense Refill  . estradiol (ESTRACE) 0.5 MG tablet Take 0.5 mg by mouth daily.    . fluticasone (FLONASE) 50 MCG/ACT nasal spray Place 1 spray into both nostrils daily.    . hyoscyamine (LEVSIN SL) 0.125 MG SL tablet Place 1 tablet (0.125 mg total) under the tongue every 6 (six) hours. 30 tablet 2  . loratadine (CLARITIN) 10  MG tablet Take 10 mg by mouth daily.       Physical Exam: Blood pressure 98/60, pulse 79, temperature 97.9 F (36.6 C), temperature source Oral, resp. rate 16, SpO2 98 %. General: pleasant, WD, WN white female who is laying in bed in NAD HEENT: head is normocephalic, atraumatic.  Sclera are noninjected.  PERRL.  Ears and nose without any masses or lesions.  Mouth is pink and moist Heart: regular, rate, and rhythm.  Normal s1,s2. No obvious murmurs, gallops, or rubs noted.  Palpable radial and pedal pulses bilaterally Lungs: CTAB, no wheezes, rhonchi, or rales noted.  Respiratory  effort nonlabored Abd: soft, mild tenderness in LLQ, but no guarding or rebound present, ND, +BS, no masses, hernias, or organomegaly MS: all 4 extremities are symmetrical with no cyanosis, clubbing, or edema. Skin: warm and dry with no masses, lesions, or rashes Psych: A&Ox3 with an appropriate affect.   Results for orders placed or performed during the hospital encounter of 12/25/18 (from the past 48 hour(s))  CBC WITH DIFFERENTIAL     Status: Abnormal   Collection Time: 12/25/18  9:17 PM  Result Value Ref Range   WBC 9.1 4.0 - 10.5 K/uL   RBC 3.86 (L) 3.87 - 5.11 MIL/uL   Hemoglobin 12.3 12.0 - 15.0 g/dL   HCT 38.3 36.0 - 46.0 %   MCV 99.2 80.0 - 100.0 fL   MCH 31.9 26.0 - 34.0 pg   MCHC 32.1 30.0 - 36.0 g/dL   RDW 12.3 11.5 - 15.5 %   Platelets 381 150 - 400 K/uL   nRBC 0.0 0.0 - 0.2 %   Neutrophils Relative % 68 %   Neutro Abs 6.3 1.7 - 7.7 K/uL   Lymphocytes Relative 20 %   Lymphs Abs 1.8 0.7 - 4.0 K/uL   Monocytes Relative 7 %   Monocytes Absolute 0.7 0.1 - 1.0 K/uL   Eosinophils Relative 3 %   Eosinophils Absolute 0.3 0.0 - 0.5 K/uL   Basophils Relative 1 %   Basophils Absolute 0.1 0.0 - 0.1 K/uL   Immature Granulocytes 1 %   Abs Immature Granulocytes 0.06 0.00 - 0.07 K/uL    Comment: Performed at [facility], [place] [address] [place],  [address]   Ct Abdomen Pelvis W Contrast  Result Date: 12/25/2018 CLINICAL DATA:  Intermittent left lower quadrant abdominal pain for the past 3 months. EXAM: CT ABDOMEN AND PELVIS WITH CONTRAST TECHNIQUE: Multidetector CT imaging of the abdomen and pelvis was performed using the standard protocol following bolus administration of intravenous contrast. CONTRAST:  153mL ISOVUE-300 IOPAMIDOL (ISOVUE-300) INJECTION 61% COMPARISON:  None. FINDINGS: Lower chest: No acute abnormality. Hepatobiliary: No focal liver abnormality. Cholelithiasis. No gallbladder wall thickening or biliary dilatation. Pancreas: Unremarkable.  No pancreatic ductal dilatation or surrounding inflammatory changes. Spleen: Normal in size without focal abnormality. Adrenals/Urinary Tract: Adrenal glands are unremarkable. Kidneys are normal, without renal calculi, focal lesion, or hydronephrosis. Bladder is unremarkable. Stomach/Bowel: The stomach is within normal limits. Sigmoid diverticulosis with focal irregular wall thickening of the mid sigmoid colon with mild adjacent pericolonic fat stranding. The inflammatory changes are centered on a rim enhancing, fluid containing diverticulum measuring 1.4 x 1.7 x 1.3 cm. The small bowel is unremarkable. Vascular/Lymphatic: Mild aortic atherosclerosis. No enlarged abdominal or pelvic lymph nodes. Reproductive: Status post hysterectomy. No adnexal masses. Other: No free fluid or pneumoperitoneum.  No fluid collection. Musculoskeletal: No acute or significant osseous findings. IMPRESSION: 1. Focal irregular wall thickening of the  mid sigmoid colon with adjacent pericolonic inflammatory changes centered on a rim enhancing, fluid containing diverticulum measuring 1.7 cm. Findings consistent with diverticulitis with small intra-diverticular abscess. Given the relatively focal irregular wall thickening, in the non-urgent setting following complete resolution of the patient's symptoms, colonoscopy is recommended to exclude the possibility of an underlying neoplastic lesion. 2. Cholelithiasis. Electronically Signed   By: Titus Dubin M.D.   On: 12/25/2018 08:38      Assessment/Plan Diverticulitis  The patient appears to have failed multiple trials of outpatient oral abx therapy.  Her CT scan does not show evidence of complicated diverticulitis.  She is not toxic with a normal WBC and a benign abdominal exam.  No indications for surgery at this time.  The findings of a possible "intra-diverticular abscess" are noted, but even if this is a true abscess, there is nothing to do for this currently as it is not a  drainable collection since it is inside the colon, and it does not require surgical intervention.  Continue abx therapy for now.  Agree with clear liquids.  No current diarrhea, but if she redevelops diarrhea, given multiple course of abx therapy, it may be beneficial to rule out c diff as a potential cause or some of these findings as well.  We will follow with you.   FEN - clears VTE - ok for chemical prophylaxis from our standpoint ID - [email or web address]  Henreitta Cea, [facility] 12/26/2018, 1:04 PM Pager: [phone]

## 2018-12-26 NOTE — Progress Notes (Addendum)
[facility] Progress Note  CC:  Diverticulitis with abscess  Subjective:  Feeling ok today.  Abdomen is sore.  Tolerating clear liquids.  Having some loose stools, several episodes but small amounts at a time.  No blood in stool.  Objective:  Vital signs in last 24 hours: Temp:  [97.8 F (36.6 C)-97.9 F (36.6 C)] 97.9 F (36.6 C) (01/10 0517) Pulse Rate:  [79-80] 79 (01/10 0517) Resp:  [16-17] 16 (01/10 0517) BP: (98-114)/(60-69) 98/60 (01/10 0517) SpO2:  [98 %] 98 % (01/10 0517) Last BM Date: 12/26/18 General:  Alert, Well-developed, in NAD Heart:  Regular rate and rhythm; no murmurs Pulm:  CTAB.  No increased WOB. Abdomen:  Soft, non-distended.  BS present.  Mild lower abdominal TTP. Extremities:  Without edema. Neurologic:  Alert and oriented x 4;  grossly normal neurologically. Psych:  Alert and cooperative. Normal mood and affect.  Intake/Output from previous day: 01/09 0701 - 01/10 0700 In: 587.2 [P.O.:100; I.V.:461.9; IV Piggyback:25.3] Out: 2 [Urine:1; Stool:1] Intake/Output this shift: Total I/O In: 341.2 [I.V.:300; IV Piggyback:41.2] Out: 2 [Urine:1; Stool:1]  Lab Results: Recent Labs    12/25/18 2117  WBC 9.1  HGB 12.3  HCT 38.3  PLT 381   BMET Recent Labs    12/24/18 1137  NA 137  K 4.3  CL 101  CO2 30  GLUCOSE 89  BUN 9  CREATININE 0.79  CALCIUM 9.4   Ct Abdomen Pelvis W Contrast  Result Date: 12/25/2018 CLINICAL DATA:  Intermittent left lower quadrant abdominal pain for the past 3 months. EXAM: CT ABDOMEN AND PELVIS WITH CONTRAST TECHNIQUE: Multidetector CT imaging of the abdomen and pelvis was performed using the standard protocol following bolus administration of intravenous contrast. CONTRAST:  146mL ISOVUE-300 IOPAMIDOL (ISOVUE-300) INJECTION 61% COMPARISON:  None. FINDINGS: Lower chest: No acute abnormality. Hepatobiliary: No focal liver abnormality. Cholelithiasis. No gallbladder wall thickening or biliary dilatation.  Pancreas: Unremarkable. No pancreatic ductal dilatation or surrounding inflammatory changes. Spleen: Normal in size without focal abnormality. Adrenals/Urinary Tract: Adrenal glands are unremarkable. Kidneys are normal, without renal calculi, focal lesion, or hydronephrosis. Bladder is unremarkable. Stomach/Bowel: The stomach is within normal limits. Sigmoid diverticulosis with focal irregular wall thickening of the mid sigmoid colon with mild adjacent pericolonic fat stranding. The inflammatory changes are centered on a rim enhancing, fluid containing diverticulum measuring 1.4 x 1.7 x 1.3 cm. The small bowel is unremarkable. Vascular/Lymphatic: Mild aortic atherosclerosis. No enlarged abdominal or pelvic lymph nodes. Reproductive: Status post hysterectomy. No adnexal masses. Other: No free fluid or pneumoperitoneum.  No fluid collection. Musculoskeletal: No acute or significant osseous findings. IMPRESSION: 1. Focal irregular wall thickening of the mid sigmoid colon with adjacent pericolonic inflammatory changes centered on a rim enhancing, fluid containing diverticulum measuring 1.7 cm. Findings consistent with diverticulitis with small intra-diverticular abscess. Given the relatively focal irregular wall thickening, in the non-urgent setting following complete resolution of the patient's symptoms, colonoscopy is recommended to exclude the possibility of an underlying neoplastic lesion. 2. Cholelithiasis. Electronically Signed   By: Titus Dubin M.D.   On: 12/25/2018 08:38   Assessment / Plan: *Diverticulitis with intradiverticular abscess seen on CT scan:  Has some chronic GI symptoms but acutely this episode seems to have started at the end of October.  On zosyn here.  -Continue abx. -Please have surgery see her as she would likely benefit from elective resection in the future.  Hopefully will not need any intervention during this hospitalization. -Will need repeat colonoscopy  as outpatient once this  acute episode resolves, probably 6-8 weeks out.   LOS: 1 day   Laban Emperor. Zehr  12/26/2018, 10:48 AM     [facility] Attending   I have taken an interval history, reviewed the chart and examined the patient. I agree with the Advanced Practitioner's note, impression and recommendations.   She has failed outpatient tx. Tx IV Abx per surgery recs.  We will be available if needed but will defer to surgery for treatment now and to consider elective segmental colectomy.    Gatha Mayer, MD, [facility] 12/26/2018 12:09 PM Pager [phone]

## 2018-12-26 NOTE — Plan of Care (Signed)

## 2018-12-26 NOTE — Progress Notes (Signed)
   PROGRESS NOTE  Stephanie Hoover IWL:[id number] DOB: May 06, 1956 DOA: 12/25/2018 PCP: System, Pcp Not In  HPI/Recap of past 24 hours:  + ab pain, no fever, bp low normal No n/v  She requested probiotics  Assessment/Plan: Principal Problem:   Diverticulitis of large intestine with abscess   Diverticulitis with abscess of sigmoid colon  -failed outpatient abx treatment, direct admission from GI's office -she is started on zosyn, general surgery and gi consulted, input appreciated. -on clears as tolerated, continue ivf  Code Status: full  Family Communication: patient   Disposition Plan: not ready to discharge   Consultants:  GI  General surgery  Procedures:  none  Antibiotics:  zosyn   Objective: BP 96/60 (BP Location: Left Arm)   Pulse 72   Temp 98.2 F (36.8 C) (Oral)   Resp 16   SpO2 100%   Intake/Output Summary (Last 24 hours) at 12/26/2018 1756 Last data filed at 12/26/2018 1746 Gross per 24 hour  Intake 1558.48 ml  Output 4 ml  Net 1554.48 ml   There were no vitals filed for this visit.  Exam: Patient is examined daily including today on 12/26/2018, exams remain the same as of yesterday except that has changed    General:  NAD  Cardiovascular: RRR  Respiratory: CTABL  Abdomen: Soft/ND. Mild left lower quadrant tenderness, positive BS  Musculoskeletal: No Edema  Neuro: alert, oriented   Data Reviewed: Basic Metabolic Panel: Recent Labs  Lab 12/24/18 1137  NA 137  K 4.3  CL 101  CO2 30  GLUCOSE 89  BUN 9  CREATININE 0.79  CALCIUM 9.4   Liver Function Tests: No results for input(s): AST, ALT, ALKPHOS, BILITOT, PROT, ALBUMIN in the last 168 hours. No results for input(s): LIPASE, AMYLASE in the last 168 hours. No results for input(s): AMMONIA in the last 168 hours. CBC: Recent Labs  Lab 12/25/18 2117  WBC 9.1  NEUTROABS 6.3  HGB 12.3  HCT 38.3  MCV 99.2  PLT 381   Cardiac Enzymes:   No results for input(s): CKTOTAL, CKMB,  CKMBINDEX, TROPONINI in the last 168 hours. BNP (last 3 results) No results for input(s): BNP in the last 8760 hours.  ProBNP (last 3 results) No results for input(s): PROBNP in the last 8760 hours.  CBG: No results for input(s): GLUCAP in the last 168 hours.  No results found for this or any previous visit (from the past 240 hour(s)).   Studies: No results found.  Scheduled Meds: . saccharomyces boulardii  250 mg Oral BID    Continuous Infusions: . sodium chloride 75 mL/hr at 12/26/18 1318  . sodium chloride    . piperacillin-tazobactam (ZOSYN)  IV 3.375 g (12/26/18 1319)     Time spent: 7mins,. Case discussed with GI and General surgery I have personally reviewed and interpreted on  12/26/2018 daily labs, imagings as discussed above under date review session and assessment and plans.  I reviewed all nursing notes, pharmacy notes, consultant notes,  vitals, pertinent old records  I have discussed plan of care as described above with RN , patient  on 12/26/2018   Florencia Reasons MD, PhD  [facility] Pager [phone]. If 7PM-7AM, please contact night-coverage at [email or web address], password [facility] 12/26/2018, 5:56 PM  LOS: 1 day

## 2018-12-26 NOTE — Progress Notes (Signed)
Patient arrived in the unit escorted by husband, direct admit from GI labauer, pt is alert and oriented, ambulatory, no signs of distress noted, pain is 3 out of 10, provider on call paged of pt arrival, awaiting for admitting orders.

## 2018-12-27 LAB — C DIFFICILE QUICK SCREEN W PCR REFLEX
C Diff antigen: NEGATIVE
C Diff interpretation: NOT DETECTED
C Diff toxin: NEGATIVE

## 2018-12-27 MED ORDER — LOPERAMIDE HCL 2 MG PO CAPS: 2.0000 mg | ORAL_CAPSULE | Freq: Four times a day (QID) | ORAL | Status: DC | PRN

## 2018-12-27 NOTE — Plan of Care (Signed)
Pt is stable. Plane of care reviewed.

## 2018-12-27 NOTE — Progress Notes (Signed)
   PROGRESS NOTE  Stephanie Hoover OEU:[id number] DOB: December 03, 1956 DOA: 12/25/2018 PCP: System, Pcp Not In  HPI/Recap of past 24 hours:  + ab pain, has 6 loose bm this am no fever, bp low normal No n/v Left lower quadrant pain    Assessment/Plan: Principal Problem:   Diverticulitis of large intestine with abscess   Diverticulitis with abscess of sigmoid colon  -failed outpatient abx treatment, direct admission from GI's office -she is started on zosyn, general surgery and gi consulted, input appreciated. -continue ivf, diet per general surgery/GI  Code Status: full  Family Communication: patient and husband at bedside  Disposition Plan: not ready to discharge   Consultants:  GI  General surgery  Procedures:  none  Antibiotics:  zosyn   Objective: BP (!) 91/50 (BP Location: Left Arm)   Pulse 78   Temp 98.5 F (36.9 C) (Oral)   Resp 16   SpO2 99%   Intake/Output Summary (Last 24 hours) at 12/27/2018 1114 Last data filed at 12/27/2018 1000 Gross per 24 hour  Intake 3106.7 ml  Output -  Net 3106.7 ml   There were no vitals filed for this visit.  Exam: Patient is examined daily including today on 12/27/2018, exams remain the same as of yesterday except that has changed    General:  NAD  Cardiovascular: RRR  Respiratory: CTABL  Abdomen: Soft/ND. Mild left lower quadrant tenderness, positive BS  Musculoskeletal: No Edema  Neuro: alert, oriented   Data Reviewed: Basic Metabolic Panel: Recent Labs  Lab 12/24/18 1137  NA 137  K 4.3  CL 101  CO2 30  GLUCOSE 89  BUN 9  CREATININE 0.79  CALCIUM 9.4   Liver Function Tests: No results for input(s): AST, ALT, ALKPHOS, BILITOT, PROT, ALBUMIN in the last 168 hours. No results for input(s): LIPASE, AMYLASE in the last 168 hours. No results for input(s): AMMONIA in the last 168 hours. CBC: Recent Labs  Lab 12/25/18 2117  WBC 9.1  NEUTROABS 6.3  HGB 12.3  HCT 38.3  MCV 99.2  PLT 381   Cardiac  Enzymes:   No results for input(s): CKTOTAL, CKMB, CKMBINDEX, TROPONINI in the last 168 hours. BNP (last 3 results) No results for input(s): BNP in the last 8760 hours.  ProBNP (last 3 results) No results for input(s): PROBNP in the last 8760 hours.  CBG: No results for input(s): GLUCAP in the last 168 hours.  No results found for this or any previous visit (from the past 240 hour(s)).   Studies: No results found.  Scheduled Meds: . saccharomyces boulardii  250 mg Oral BID    Continuous Infusions: . sodium chloride 75 mL/hr at 12/27/18 0400  . sodium chloride    . piperacillin-tazobactam (ZOSYN)  IV 3.375 g (12/27/18 0446)     Time spent: 49mins,.  I have personally reviewed and interpreted on  12/27/2018 daily labs, imagings as discussed above under date review session and assessment and plans.  I reviewed all nursing notes, pharmacy notes, consultant notes,  vitals, pertinent old records  I have discussed plan of care as described above with RN , patient and husband   on 12/27/2018   Florencia Reasons MD, PhD  [facility] Pager [phone]. If 7PM-7AM, please contact night-coverage at [email or web address], password [facility] 12/27/2018, 11:14 AM  LOS: 2 days

## 2018-12-27 NOTE — Progress Notes (Signed)
[facility] Office:  [phone] General Surgery Progress Note   LOS: 2 days  POD -     Chief Complaint: Abdominal pain  Assessment and Plan: 1.  Diverticulitis  On Zosyn - 1/9 >>>  Better - minimal symptoms  Will advance diet and recheck CBC tomorrow  2.  DVT prophylaxis - no chemical prophylaxis   Principal Problem:   Diverticulitis of large intestine with abscess  Subjective:  Doing better.  On clear liquids.  She lives near [place], [place]) - but works up here.  She'll need to decide where she wants to be evaluated and cared for.  Objective:   Vitals:   12/26/18 2133 12/27/18 0408  BP: 103/72 (!) 91/50  Pulse: 69 78  Resp: 18 16  Temp: 97.7 F (36.5 C) 98.5 F (36.9 C)  SpO2: 100% 99%     Intake/Output from previous day:  01/10 0701 - 01/11 0700 In: 2857.8 [P.O.:920; I.V.:1796.6; IV Piggyback:141.3] Out: 2 [Urine:1; Stool:1]  Intake/Output this shift:  No intake/output data recorded.   Physical Exam:   General: WN WRF who is alert and oriented.    HEENT: Normal. Pupils equal. .   Lungs: Clear.   Abdomen: Minimal lower abdominal tenderness.  BS active.   Lab Results:    Recent Labs    12/25/18 2117  WBC 9.1  HGB 12.3  HCT 38.3  PLT 381    BMET   Recent Labs    12/24/18 1137  NA 137  K 4.3  CL 101  CO2 30  GLUCOSE 89  BUN 9  CREATININE 0.79  CALCIUM 9.4    PT/INR  No results for input(s): LABPROT, INR in the last 72 hours.  ABG  No results for input(s): PHART, HCO3 in the last 72 hours.  Invalid input(s): PCO2, PO2   Studies/Results:  No results found.   Anti-infectives:   Anti-infectives (From admission, onward)   Start     Dose/Rate Route Frequency Ordered Stop   12/25/18 2100  piperacillin-tazobactam (ZOSYN) IVPB 3.375 g     3.375 g 12.5 mL/hr over 240 Minutes Intravenous Every 8 hours 12/25/18 2058        Alphonsa Overall, MD, FACS Pager: [facility] Office:  [phone] 12/27/2018

## 2018-12-27 NOTE — Plan of Care (Signed)

## 2018-12-28 LAB — BASIC METABOLIC PANEL
Anion gap: 7 (ref 5–15)
BUN: 5 mg/dL — ABNORMAL LOW (ref 8–23)
CO2: 25 mmol/L (ref 22–32)
Calcium: 8.5 mg/dL — ABNORMAL LOW (ref 8.9–10.3)
Chloride: 110 mmol/L (ref 98–111)
Creatinine, Ser: 0.9 mg/dL (ref 0.44–1.00)
GFR calc Af Amer: >60 mL/min (ref >60)
GFR calc non Af Amer: >60 mL/min (ref >60)
Glucose, Bld: 90 mg/dL (ref 70–99)
Potassium: 3.8 mmol/L (ref 3.5–5.1)
Sodium: 142 mmol/L (ref 135–145)

## 2018-12-28 LAB — GASTROINTESTINAL PANEL BY PCR, STOOL (REPLACES STOOL CULTURE)

## 2018-12-28 LAB — CBC WITH DIFFERENTIAL/PLATELET
Abs Immature Granulocytes: 0.02 10*3/uL (ref 0.00–0.07)
Basophils Absolute: 0.1 10*3/uL (ref 0.0–0.1)
Basophils Relative: 1 %
Eosinophils Absolute: 0.2 10*3/uL (ref 0.0–0.5)
Eosinophils Relative: 4 %
HCT: 33.5 % — ABNORMAL LOW (ref 36.0–46.0)
Hemoglobin: 10.7 g/dL — ABNORMAL LOW (ref 12.0–15.0)
Immature Granulocytes: 0 %
Lymphocytes Relative: 40 %
Lymphs Abs: 2.3 10*3/uL (ref 0.7–4.0)
MCH: 31.6 pg (ref 26.0–34.0)
MCHC: 31.9 g/dL (ref 30.0–36.0)
MCV: 98.8 fL (ref 80.0–100.0)
Monocytes Absolute: 0.5 10*3/uL (ref 0.1–1.0)
Monocytes Relative: 9 %
Neutro Abs: 2.7 10*3/uL (ref 1.7–7.7)
Neutrophils Relative %: 46 %
Platelets: 327 10*3/uL (ref 150–400)
RBC: 3.39 MIL/uL — ABNORMAL LOW (ref 3.87–5.11)
RDW: 12.2 % (ref 11.5–15.5)
WBC: 5.7 10*3/uL (ref 4.0–10.5)
nRBC: 0 % (ref 0.0–0.2)

## 2018-12-28 LAB — MAGNESIUM: Magnesium: 2 mg/dL (ref 1.7–2.4)

## 2018-12-28 NOTE — Progress Notes (Signed)
PROGRESS NOTE  Stephanie Hoover AUQ:[id number] DOB: Mar 11, 1956 DOA: 12/25/2018 PCP: System, Pcp Not In  HPI/Recap of past 24 hours:  Less ab pain, has 3 loose bm this am no fever, bp low normal No n/v Husband at bedside    Assessment/Plan: Principal Problem:   Diverticulitis of large intestine with abscess   Diverticulitis with abscess of sigmoid colon  -failed outpatient abx treatment, direct admission from GI's office -she is started on zosyn, general surgery and gi consulted, input appreciated. -diet per general surgery/GI -improving, likely d/c in am if able to tolerate diet advancement and clears by general surgery. abx choice and duration per general surgery  Code Status: full  Family Communication: patient and husband at bedside  Disposition Plan: home , likely on Monday with general surgery clearance   Consultants:  GI  General surgery  Procedures:  none  Antibiotics:  zosyn   Objective: BP 110/74   Pulse 74   Temp 98.5 F (36.9 C)   Resp 15   Ht 5' (1.524 m)   Wt 56.2 kg   SpO2 98%   BMI 24.18 kg/m   Intake/Output Summary (Last 24 hours) at 12/28/2018 1208 Last data filed at 12/28/2018 1002 Gross per 24 hour  Intake 2462.59 ml  Output -  Net 2462.59 ml   Filed Weights   12/27/18 2300  Weight: 56.2 kg    Exam: Patient is examined daily including today on 12/28/2018, exams remain the same as of yesterday except that has changed    General:  NAD  Cardiovascular: RRR  Respiratory: CTABL  Abdomen: Soft/ND. Mild left lower quadrant tenderness, positive BS  Musculoskeletal: No Edema  Neuro: alert, oriented   Data Reviewed: Basic Metabolic Panel: Recent Labs  Lab 12/24/18 1137 12/28/18 0336  NA 137 142  K 4.3 3.8  CL 101 110  CO2 30 25  GLUCOSE 89 90  BUN 9 5*  CREATININE 0.79 0.90  CALCIUM 9.4 8.5*  MG  --  2.0   Liver Function Tests: No results for input(s): AST, ALT, ALKPHOS, BILITOT, PROT, ALBUMIN in the last 168  hours. No results for input(s): LIPASE, AMYLASE in the last 168 hours. No results for input(s): AMMONIA in the last 168 hours. CBC: Recent Labs  Lab 12/25/18 2117 12/28/18 0336  WBC 9.1 5.7  NEUTROABS 6.3 2.7  HGB 12.3 10.7*  HCT 38.3 33.5*  MCV 99.2 98.8  PLT 381 327   Cardiac Enzymes:   No results for input(s): CKTOTAL, CKMB, CKMBINDEX, TROPONINI in the last 168 hours. BNP (last 3 results) No results for input(s): BNP in the last 8760 hours.  ProBNP (last 3 results) No results for input(s): PROBNP in the last 8760 hours.  CBG: No results for input(s): GLUCAP in the last 168 hours.  Recent Results (from the past 240 hour(s))  C difficile quick scan w PCR reflex     Status: None   Collection Time: 12/27/18 11:14 AM  Result Value Ref Range Status   C Diff antigen NEGATIVE NEGATIVE Final   C Diff toxin NEGATIVE NEGATIVE Final   C Diff interpretation No C. difficile detected.  Final    Comment: Performed at [facility], [place] [address] [place], [place] [address]     Studies: No results found.  Scheduled Meds: . saccharomyces boulardii  250 mg Oral BID    Continuous Infusions: . sodium chloride    . piperacillin-tazobactam (ZOSYN)  IV 12.5 mL/hr at 12/28/18 0600  Time spent: 66mins,.  I have personally reviewed and interpreted on  12/28/2018 daily labs, imagings as discussed above under date review session and assessment and plans.  I reviewed all nursing notes, pharmacy notes, consultant notes,  vitals, pertinent old records  I have discussed plan of care as described above with RN , patient and husband   on 12/28/2018   Florencia Reasons MD, PhD  [facility] Pager [phone]. If 7PM-7AM, please contact night-coverage at [email or web address], password [facility] 12/28/2018, 12:08 PM  LOS: 3 days

## 2018-12-28 NOTE — Progress Notes (Signed)
[facility] Office:  [phone] General Surgery Progress Note   LOS: 3 days  POD -     Chief Complaint: Abdominal pain  Assessment and Plan: 1.  Diverticulitis  On Zosyn - 1/9 >>>  WBC - 5,700 - 12/28/2018  Better - minimal symptoms  Will advance diet to regular diet.  2.  DVT prophylaxis - no chemical prophylaxis   Principal Problem:   Diverticulitis of large intestine with abscess  Subjective:  Doing better.  Tolerated full liquids - will advance diet.  She lives near [place], [place]) - but works up here.  She'll need to decide where she wants to be evaluated and cared for.  Objective:   Vitals:   12/27/18 2106 12/28/18 0452  BP: 102/63 110/74  Pulse: 74 74  Resp: 17 15  Temp: 98.3 F (36.8 C) 98.5 F (36.9 C)  SpO2: 99% 98%     Intake/Output from previous day:  01/11 0701 - 01/12 0700 In: 2749.8 [P.O.:1140; I.V.:1457.3; IV Piggyback:152.5] Out: -   Intake/Output this shift:  Total I/O In: 240 [P.O.:240] Out: -    Physical Exam:   General: WN WRF who is alert and oriented.    HEENT: Normal. Pupils equal. .   Lungs: Clear.   Abdomen: Minimal lower abdominal tenderness.  BS active.   Lab Results:    Recent Labs    12/25/18 2117 12/28/18 0336  WBC 9.1 5.7  HGB 12.3 10.7*  HCT 38.3 33.5*  PLT 381 327    BMET   Recent Labs    12/28/18 0336  NA 142  K 3.8  CL 110  CO2 25  GLUCOSE 90  BUN 5*  CREATININE 0.90  CALCIUM 8.5*    PT/INR  No results for input(s): LABPROT, INR in the last 72 hours.  ABG  No results for input(s): PHART, HCO3 in the last 72 hours.  Invalid input(s): PCO2, PO2   Studies/Results:  No results found.   Anti-infectives:   Anti-infectives (From admission, onward)   Start     Dose/Rate Route Frequency Ordered Stop   12/25/18 2100  piperacillin-tazobactam (ZOSYN) IVPB 3.375 g     3.375 g 12.5 mL/hr over 240 Minutes Intravenous Every 8 hours 12/25/18 2058        Alphonsa Overall, MD,  FACS Pager: [facility] Office: [phone] 12/28/2018

## 2018-12-28 NOTE — Plan of Care (Signed)
  Problem: Education: Goal: Knowledge of General Education information will improve Description Including pain rating scale, medication(s)/side effects and non-pharmacologic comfort measures Outcome: Progressing   

## 2018-12-28 NOTE — Plan of Care (Signed)

## 2018-12-29 ENCOUNTER — Telehealth: Payer: Self-pay

## 2018-12-29 LAB — CBC WITH DIFFERENTIAL/PLATELET
Abs Immature Granulocytes: 0.02 10*3/uL (ref 0.00–0.07)
Basophils Absolute: 0 10*3/uL (ref 0.0–0.1)
Basophils Relative: 1 %
Eosinophils Absolute: 0.3 10*3/uL (ref 0.0–0.5)
Eosinophils Relative: 4 %
HCT: 36.3 % (ref 36.0–46.0)
Hemoglobin: 11.7 g/dL — ABNORMAL LOW (ref 12.0–15.0)
Immature Granulocytes: 0 %
Lymphocytes Relative: 28 %
Lymphs Abs: 1.8 10*3/uL (ref 0.7–4.0)
MCH: 31.7 pg (ref 26.0–34.0)
MCHC: 32.2 g/dL (ref 30.0–36.0)
MCV: 98.4 fL (ref 80.0–100.0)
Monocytes Absolute: 0.5 10*3/uL (ref 0.1–1.0)
Monocytes Relative: 8 %
Neutro Abs: 3.8 10*3/uL (ref 1.7–7.7)
Neutrophils Relative %: 59 %
Platelets: 331 10*3/uL (ref 150–400)
RBC: 3.69 MIL/uL — ABNORMAL LOW (ref 3.87–5.11)
RDW: 12 % (ref 11.5–15.5)
WBC: 6.5 10*3/uL (ref 4.0–10.5)
nRBC: 0 % (ref 0.0–0.2)

## 2018-12-29 LAB — BASIC METABOLIC PANEL
Anion gap: 11 (ref 5–15)
BUN: 11 mg/dL (ref 8–23)
CO2: 24 mmol/L (ref 22–32)
Calcium: 8.7 mg/dL — ABNORMAL LOW (ref 8.9–10.3)
Chloride: 107 mmol/L (ref 98–111)
Creatinine, Ser: 0.95 mg/dL (ref 0.44–1.00)
GFR calc Af Amer: >60 mL/min (ref >60)
GFR calc non Af Amer: >60 mL/min (ref >60)
Glucose, Bld: 94 mg/dL (ref 70–99)
Potassium: 4 mmol/L (ref 3.5–5.1)
Sodium: 142 mmol/L (ref 135–145)

## 2018-12-29 MED ORDER — SACCHAROMYCES BOULARDII 250 MG PO CAPS: 250.0000 mg | ORAL_CAPSULE | Freq: Two times a day (BID) | ORAL | 0 refills | Status: DC

## 2018-12-29 MED ORDER — CIPROFLOXACIN HCL 500 MG PO TABS: 500.0000 mg | ORAL_TABLET | Freq: Two times a day (BID) | ORAL | 0 refills | Status: AC

## 2018-12-29 MED ORDER — METRONIDAZOLE 500 MG PO TABS: 500.0000 mg | ORAL_TABLET | Freq: Three times a day (TID) | ORAL | 0 refills | Status: AC

## 2018-12-29 NOTE — Telephone Encounter (Signed)
-----   Message from Levin Erp, [place] sent at 12/29/2018 10:49 AM EST ----- Regarding: Needs follow up with me Needs follow up with me or Dr. Henrene Pastor in 2 weeks for diverticulitis.  Leaving today.  Thanks-JLL

## 2018-12-29 NOTE — Telephone Encounter (Signed)
appt made for 1/30 at 830 am with Dr Henrene Pastor.  Pt to be notified at discharge

## 2018-12-29 NOTE — Progress Notes (Addendum)
Patient ID: Stephanie Hoover, female   DOB: 30-Nov-1956, 63 y.o.   MRN: [id number]       Subjective: Patient is feeling better.  Started having diarrhea again over the weekend secondary to abx, but c diff and GI panel checked and were negative.  Tolerating a solid diet.  Objective: Vital signs in last 24 hours: Temp:  [98.3 F (36.8 C)-98.4 F (36.9 C)] 98.4 F (36.9 C) (01/13 0616) Pulse Rate:  [69-81] 69 (01/13 0616) Resp:  [16-18] 17 (01/13 0616) BP: (105-115)/(52-62) 105/52 (01/13 0616) SpO2:  [98 %-99 %] 98 % (01/13 0616) Last BM Date: 12/29/18  Intake/Output from previous day: 01/12 0701 - 01/13 0700 In: 953.7 [P.O.:600; I.V.:192.3; IV Piggyback:161.4] Out: 0  Intake/Output this shift: No intake/output data recorded.  PE: Heart: regular Lungs: CTAB Abd: soft, essentially nontender, but if at all only minimally in LLQ, no guarding, +BS  Lab Results:  Recent Labs    12/28/18 0336 12/29/18 0412  WBC 5.7 6.5  HGB 10.7* 11.7*  HCT 33.5* 36.3  PLT 327 331   BMET Recent Labs    12/28/18 0336 12/29/18 0412  NA 142 142  K 3.8 4.0  CL 110 107  CO2 25 24  GLUCOSE 90 94  BUN 5* 11  CREATININE 0.90 0.95  CALCIUM 8.5* 8.7*   PT/INR No results for input(s): LABPROT, INR in the last 72 hours. CMP     Component Value Date/Time   NA 142 12/29/2018 0412   K 4.0 12/29/2018 0412   CL 107 12/29/2018 0412   CO2 24 12/29/2018 0412   GLUCOSE 94 12/29/2018 0412   BUN 11 12/29/2018 0412   CREATININE 0.95 12/29/2018 0412   CALCIUM 8.7 (L) 12/29/2018 0412   GFRNONAA >60 12/29/2018 0412   GFRAA >60 12/29/2018 0412   Lipase  No results found for: LIPASE     Studies/Results: No results found.  Anti-infectives: Anti-infectives (From admission, onward)   Start     Dose/Rate Route Frequency Ordered Stop   12/25/18 2100  piperacillin-tazobactam (ZOSYN) IVPB 3.375 g     3.375 g 12.5 mL/hr over 240 Minutes Intravenous Every 8 hours 12/25/18 2058          Assessment/Plan Diverticulitis  -seems to be resolving -recommend given persistent diverticulitis a 14 day total course of abx therapy, so an additional 10 days orally at home. -agree with probiotic to help with diarrhea. -stable for DC home from our standpoint -no plans for surgical intervention currently.  She will need a colonoscopy as an outpatient with GI. She can then follow up with one of our colorectal surgeons if she chooses to pursue further surgical care in [place].  Our information has been given to her. -we will sign off at this time.    FEN - soft diet VTE - ok for chemical prophylaxis ID - zosyn, recommend 10 more days of oral abx therapy at home with augmentin.   LOS: 4 days    Henreitta Cea , [facility] 12/29/2018, 9:39 AM Pager: [phone]

## 2018-12-29 NOTE — Progress Notes (Signed)
Reviewed discharge education, prescriptions and instructions. Patient verbalizes understanding and has no further questions at this time. IV removed. No s/s of acute distress.

## 2018-12-29 NOTE — Discharge Summary (Signed)
Discharge Summary  Stephanie Hoover OZH:[id number] DOB: [birth year]  PCP: System, Pcp Not In  Admit date: 12/25/2018 Discharge date: 12/29/2018  Time spent: 23mins, more than 50% time spent on coordination of care. Case discussed with GI and general surgery.   Recommendations for Outpatient Follow-up:  1. F/u with PCP  Dr Franne Grip within a week  for hospital discharge follow up, repeat cbc/bmp at follow up.  [facility]   o: [phone]  fax [phone]  2. F/u with [facility] 3. F/u with general surgery   Discharge Diagnoses:  Active Hospital Problems   Diagnosis Date Noted  . Diverticulitis of large intestine with abscess 12/25/2018    Resolved Hospital Problems  No resolved problems to display.    Discharge Condition: stable  Diet recommendation: regular diet  Filed Weights   12/27/18 2300  Weight: 56.2 kg    History of present illness: ( per admitting MD Dr Alcario Drought) Patient coming from: Home  I have personally briefly reviewed patient's old medical records in [place]  Chief Complaint: Diverticulitis  HPI: Stephanie Hoover is a 63 y.o. female with medical history significant of diverticulitis.  Patient has been having recurrent episodes of diverticulitis since around the end of October.  At that time she started with a left lower quadrant pain which seemed to radiate across her abdomen and saw her PCP who started Augmentin x7 days for suspected diverticulitis. After starting antibiotics patient had terrible diarrhea, but was able to finish a 7-day course. She was then okay for about a month or so but around December 17 she started again with left lower quadrant pain, again given Augmentin x10 days by her PCP. Started with diarrhea again, used Imodium for 4 days and the diarrhea seemed to improve but her pain never fully went away, she did not finish her full course of abx. On 12/27 she again felt bad and again tried to finish her leftover antibiotics, but continued  with pain. 12/28 she started having more severe pain and a low-grade fever of 99.5, by 12/30 this had eased off but she went to see her PCP and again they prescribed her Cipro and Flagyl. She started with 10-12 liquid green stools per day and discontinuedthese antibiotics Friday 12/19/18. Has been using probiotics since then, pain was somewhat better until 12/22/2018 when it came back again. She tried to go to work on Tuesday but had to leave at noon due to this pain. Used an Aleve and a heating pad and eventually fell asleep and when she awoke her pain was somewhat better,.  Her diarrhea had resolved over the past couple of days.  Patient underwent CT abd / pelvis yesterday which showed diverticulitis with intra diverticular abscess.  She was sent in to the hospital as a direct admit today.    Hospital Course:  Principal Problem:   Diverticulitis of large intestine with abscess   Diverticulitis with abscess of sigmoid colon  -failed outpatient abx treatment, direct admission from GI's office -she is started on zosyn, general surgery and gi consulted, input appreciated. -diet per general surgery/GI -improving, tolerating diet advancement, ab pain improving, no fever, no leukocytosis, d/c home with general surgery clearance.  Per general surgery need to d/c home on 10days oral abx, patient reports she had bad diarrhea with augmentin in the past, she agreed to take cipro and flagyl.  Code Status: full  Family Communication: patient and husband at bedside  Disposition Plan: home  with general surgery  clearance   Consultants:  GI  General surgery  Procedures:  none  Antibiotics:  zosyn   Discharge Exam: BP (!) 105/52 (BP Location: Left Arm)   Pulse 69   Temp 98.4 F (36.9 C) (Oral)   Resp 17   Ht 5' (1.524 m)   Wt 56.2 kg   SpO2 98%   BMI 24.18 kg/m   General: NAD Cardiovascular: RRR Respiratory: CTABL Abdomen: Soft/ND. Mild left lower quadrant  tenderness, positive BS Musculoskeletal: No Edema Neuro: alert, oriented   Discharge Instructions You were cared for by a hospitalist during your hospital stay. If you have any questions about your discharge medications or the care you received while you were in the hospital after you are discharged, you can call the unit and asked to speak with the hospitalist on call if the hospitalist that took care of you is not available. Once you are discharged, your primary care physician will handle any further medical issues. Please note that NO REFILLS for any discharge medications will be authorized once you are discharged, as it is imperative that you return to your primary care physician (or establish a relationship with a primary care physician if you do not have one) for your aftercare needs so that they can reassess your need for medications and monitor your lab values.  Discharge Instructions    Diet general   Complete by:  As directed    Increase activity slowly   Complete by:  As directed      Allergies as of 12/29/2018      Reactions   Codeine Nausea And Vomiting   Morphine Nausea And Vomiting   Phenergan Vc [promethazine-phenylephrine] Nausea And Vomiting   Promethazine    Other reaction(s): Muscle Pain Muscle twitching   Meperidine Nausea And Vomiting   Sulfadiazine Rash      Medication List    TAKE these medications   ciprofloxacin 500 MG tablet Commonly known as:  CIPRO Take 1 tablet (500 mg total) by mouth 2 (two) times daily for 10 days.   estradiol 0.5 MG tablet Commonly known as:  ESTRACE Take 0.5 mg by mouth daily.   fluticasone 50 MCG/ACT nasal spray Commonly known as:  FLONASE Place 1 spray into both nostrils daily.   hyoscyamine 0.125 MG SL tablet Commonly known as:  LEVSIN SL Place 1 tablet (0.125 mg total) under the tongue every 6 (six) hours.   loratadine 10 MG tablet Commonly known as:  CLARITIN Take 10 mg by mouth daily.   metroNIDAZOLE 500 MG  tablet Commonly known as:  FLAGYL Take 1 tablet (500 mg total) by mouth 3 (three) times daily for 10 days.   saccharomyces boulardii 250 MG capsule Commonly known as:  FLORASTOR Take 1 capsule (250 mg total) by mouth 2 (two) times daily.      Allergies  Allergen Reactions  . Codeine Nausea And Vomiting  . Morphine Nausea And Vomiting  . Phenergan Vc [Promethazine-Phenylephrine] Nausea And Vomiting  . Promethazine     Other reaction(s): Muscle Pain Muscle twitching  . Meperidine Nausea And Vomiting  . Sulfadiazine Rash   Follow-up Information    Sur[facility], [place] Follow up.   Specialty:  General Surgery Why:  Follow up with [place] if you choose to follow up with surgery in [place].  You may follow up with on of our colorectal surgeons, Dr. Annye English, Dr. Leighton Ruff, Dr. Michael Boston.  You can follow up in 4-6 weeks or after colonoscopy. Contact information:  [address] [place] [address] [phone]        Dr l Bryant Bolin Follow up in 1 week(s).   Why:  hospital discharge follow up [facility]   o: [phone]  fax [phone] [address] [place], [place] Follow up in 3 week(s).   Specialty:  Gastroenterology Contact information: [address] [place] [address] [phone]            The results of significant diagnostics from this hospitalization (including imaging, microbiology, ancillary and laboratory) are listed below for reference.    Significant Diagnostic Studies: Ct Abdomen Pelvis W Contrast  Result Date: 12/25/2018 CLINICAL DATA:  Intermittent left lower quadrant abdominal pain for the past 3 months. EXAM: CT ABDOMEN AND PELVIS WITH CONTRAST TECHNIQUE: Multidetector CT imaging of the abdomen and pelvis was performed using the standard protocol following bolus administration of intravenous contrast. CONTRAST:  156mL ISOVUE-300 IOPAMIDOL (ISOVUE-300) INJECTION 61% COMPARISON:  None. FINDINGS:  Lower chest: No acute abnormality. Hepatobiliary: No focal liver abnormality. Cholelithiasis. No gallbladder wall thickening or biliary dilatation. Pancreas: Unremarkable. No pancreatic ductal dilatation or surrounding inflammatory changes. Spleen: Normal in size without focal abnormality. Adrenals/Urinary Tract: Adrenal glands are unremarkable. Kidneys are normal, without renal calculi, focal lesion, or hydronephrosis. Bladder is unremarkable. Stomach/Bowel: The stomach is within normal limits. Sigmoid diverticulosis with focal irregular wall thickening of the mid sigmoid colon with mild adjacent pericolonic fat stranding. The inflammatory changes are centered on a rim enhancing, fluid containing diverticulum measuring 1.4 x 1.7 x 1.3 cm. The small bowel is unremarkable. Vascular/Lymphatic: Mild aortic atherosclerosis. No enlarged abdominal or pelvic lymph nodes. Reproductive: Status post hysterectomy. No adnexal masses. Other: No free fluid or pneumoperitoneum.  No fluid collection. Musculoskeletal: No acute or significant osseous findings. IMPRESSION: 1. Focal irregular wall thickening of the mid sigmoid colon with adjacent pericolonic inflammatory changes centered on a rim enhancing, fluid containing diverticulum measuring 1.7 cm. Findings consistent with diverticulitis with small intra-diverticular abscess. Given the relatively focal irregular wall thickening, in the non-urgent setting following complete resolution of the patient's symptoms, colonoscopy is recommended to exclude the possibility of an underlying neoplastic lesion. 2. Cholelithiasis. Electronically Signed   By: Titus Dubin M.D.   On: 12/25/2018 08:38    Microbiology: Recent Results (from the past 240 hour(s))  C difficile quick scan w PCR reflex     Status: None   Collection Time: 12/27/18 11:14 AM  Result Value Ref Range Status   C Diff antigen NEGATIVE NEGATIVE Final   C Diff toxin NEGATIVE NEGATIVE Final   C Diff interpretation  No C. difficile detected.  Final    Comment: Performed at [facility], [place] [address], [place], [place] [address]  Gastrointestinal Panel by PCR , Stool     Status: None   Collection Time: 12/27/18 11:14 AM  Result Value Ref Range Status   Campylobacter species NOT DETECTED NOT DETECTED Final   Plesimonas shigelloides NOT DETECTED NOT DETECTED Final   Salmonella species NOT DETECTED NOT DETECTED Final   Yersinia enterocolitica NOT DETECTED NOT DETECTED Final   Vibrio species NOT DETECTED NOT DETECTED Final   Vibrio cholerae NOT DETECTED NOT DETECTED Final   Enteroaggregative E coli (EAEC) NOT DETECTED NOT DETECTED Final   Enteropathogenic E coli (EPEC) NOT DETECTED NOT DETECTED Final   Enterotoxigenic E coli (ETEC) NOT DETECTED NOT DETECTED Final   Shiga like  toxin producing E coli (STEC) NOT DETECTED NOT DETECTED Final   Shigella/Enteroinvasive E coli (EIEC) NOT DETECTED NOT DETECTED Final   Cryptosporidium NOT DETECTED NOT DETECTED Final   Cyclospora cayetanensis NOT DETECTED NOT DETECTED Final   Entamoeba histolytica NOT DETECTED NOT DETECTED Final   Giardia lamblia NOT DETECTED NOT DETECTED Final   Adenovirus F40/41 NOT DETECTED NOT DETECTED Final   Astrovirus NOT DETECTED NOT DETECTED Final   Norovirus GI/GII NOT DETECTED NOT DETECTED Final   Rotavirus A NOT DETECTED NOT DETECTED Final   Sapovirus (I, II, IV, and V) NOT DETECTED NOT DETECTED Final    Comment: Performed at [facility], [address] [place], [place] [address]     Labs: Basic Metabolic Panel: Recent Labs  Lab 12/24/18 1137 12/28/18 0336 12/29/18 0412  NA 137 142 142  K 4.3 3.8 4.0  CL 101 110 107  CO2 30 25 24   GLUCOSE 89 90 94  BUN 9 5* 11  CREATININE 0.79 0.90 0.95  CALCIUM 9.4 8.5* 8.7*  MG  --  2.0  --    Liver Function Tests: No results for input(s): AST, ALT, ALKPHOS, BILITOT, PROT, ALBUMIN in the last 168 hours. No results for input(s): LIPASE, AMYLASE in the  last 168 hours. No results for input(s): AMMONIA in the last 168 hours. CBC: Recent Labs  Lab 12/25/18 2117 12/28/18 0336 12/29/18 0412  WBC 9.1 5.7 6.5  NEUTROABS 6.3 2.7 3.8  HGB 12.3 10.7* 11.7*  HCT 38.3 33.5* 36.3  MCV 99.2 98.8 98.4  PLT 381 327 331   Cardiac Enzymes: No results for input(s): CKTOTAL, CKMB, CKMBINDEX, TROPONINI in the last 168 hours. BNP: BNP (last 3 results) No results for input(s): BNP in the last 8760 hours.  ProBNP (last 3 results) No results for input(s): PROBNP in the last 8760 hours.  CBG: No results for input(s): GLUCAP in the last 168 hours.     Signed:  Florencia Reasons MD, PhD  [facility] 12/29/2018, 10:56 AM

## 2019-01-15 ENCOUNTER — Ambulatory Visit: Payer: Federal, State, Local not specified - PPO | Admitting: Internal Medicine

## 2019-01-15 ENCOUNTER — Encounter: Payer: Self-pay | Admitting: Internal Medicine

## 2019-01-15 VITALS — BP 110/70 | HR 76 | Ht 60.0 in | Wt 121.0 lb

## 2019-01-15 DIAGNOSIS — R1032 Left lower quadrant pain: Secondary | ICD-10-CM

## 2019-01-15 DIAGNOSIS — R935 Abnormal findings on diagnostic imaging of other abdominal regions, including retroperitoneum: Secondary | ICD-10-CM | POA: Diagnosis not present

## 2019-01-15 DIAGNOSIS — K5732 Diverticulitis of large intestine without perforation or abscess without bleeding: Secondary | ICD-10-CM | POA: Diagnosis not present

## 2019-01-15 MED ORDER — NA SULFATE-K SULFATE-MG SULF 17.5-3.13-1.6 GM/177ML PO SOLN: 1.0000 | Freq: Once | ORAL | 0 refills | Status: AC

## 2019-01-15 NOTE — Patient Instructions (Signed)
You have been scheduled for a CT scan of the abdomen and pelvis at [facility] ([address]---this is in the same building as Press [facility]).   You are scheduled on 01/21/2019  at 1:15pm. You should arrive 15 minutes prior to your appointment time for registration. Please follow the written instructions below on the day of your exam:  WARNING: IF YOU ARE ALLERGIC TO IODINE/X-RAY DYE, PLEASE NOTIFY RADIOLOGY IMMEDIATELY AT [phone]! YOU WILL BE GIVEN A 13 HOUR PREMEDICATION PREP.  1) Do not eat anything after 9:15am (4 hours prior to your test) 2) You have been given 2 bottles of oral contrast to drink. The solution may taste better if refrigerated, but do NOT add ice or any other liquid to this solution. Shake well before drinking.    Drink 1 bottle of contrast @ 11:15am (2 hours prior to your exam)  Drink 1 bottle of contrast @ 12:15am (1 hour prior to your exam)  You may take any medications as prescribed with a small amount of water, if necessary. If you take any of the following medications: METFORMIN, GLUCOPHAGE, GLUCOVANCE, AVANDAMET, RIOMET, FORTAMET, [place] MET, JANUMET, GLUMETZA or METAGLIP, you MAY be asked to HOLD this medication 48 hours AFTER the exam.  The purpose of you drinking the oral contrast is to aid in the visualization of your intestinal tract. The contrast solution may cause some diarrhea. Depending on your individual set of symptoms, you may also receive an intravenous injection of x-ray contrast/dye. Plan on being at [facility] for 30 minutes or longer, depending on the type of exam you are having performed.  This test typically takes 30-45 minutes to complete.  If you have any questions regarding your exam or if you need to reschedule, you may call the CT department at [phone] between the hours of 8:00 am and 5:00 pm, Monday-Friday.  ________________________________________________________________________   Stephanie Hoover have been  scheduled for a colonoscopy. Please follow written instructions given to you at your visit today.  Please pick up your prep supplies at the pharmacy within the next 1-3 days. If you use inhalers (even only as needed), please bring them with you on the day of your procedure. Your physician has requested that you go to www.startemmi.com and enter the access code given to you at your visit today. This web site gives a general overview about your procedure. However, you should still follow specific instructions given to you by our office regarding your preparation for the procedure.

## 2019-01-15 NOTE — Progress Notes (Signed)
HISTORY OF PRESENT ILLNESS:  Stephanie Hoover is a 63 y.o. female with recurrent acute diverticulitis culminating in small diverticular abscess for which she was admitted to the hospital December 25, 2018.  She was placed on IV antibiotics and hospitalized for 4 days.  She saw general surgery.  She was discharged home on oral metronidazole and ciprofloxacin.  She finished these antibiotics last week.  She was sent today for GI follow-up requesting colonoscopy prior to consideration of segmental colectomy for recurrent and complicated diverticular disease.  Since discharge her complaints have been diarrhea up until yesterday.  As well generalized fatigue and decreased appetite.  Some intermittent left lower quadrant discomfort though not severe.  No fevers.  The patient did have a colonoscopy at [facility] with Dr. Delena Bali August 11, 2007.  The examination revealed sigmoid diverticulosis but was otherwise normal.  Blood work from December 29, 2018 revealed CBC with white blood cell count 6.5.  Hemoglobin 11.7.  Normal comprehensive metabolic panel.  Most recent CT scan prior to admission was performed December 24, 2018.  Demonstrating small intra-diverticular abscess.  REVIEW OF SYSTEMS:  All non-GI ROS negative unless otherwise stated in the HPI except for back pain, sleeping problems  Past Medical History:  Diagnosis Date  . Arthritis   . Cholelithiasis   . Diverticular disease   . Diverticulitis   . Hyperlipidemia   . IBS (irritable bowel syndrome)   . Kidney stone   . Pilonidal cyst     Past Surgical History:  Procedure Laterality Date  . ABDOMINAL HYSTERECTOMY     total  . APPENDECTOMY      Social History Stephanie Hoover  reports that she has never smoked. She has never used smokeless tobacco. She reports current alcohol use. She reports that she does not use drugs.  family history includes Gallbladder disease in her sister.  Allergies  Allergen Reactions  . Codeine Nausea And  Vomiting  . Morphine Nausea And Vomiting  . Phenergan Vc [Promethazine-Phenylephrine] Nausea And Vomiting  . Promethazine     Other reaction(s): Muscle Pain Muscle twitching  . Meperidine Nausea And Vomiting  . Sulfadiazine Rash       PHYSICAL EXAMINATION: Vital signs: BP 110/70   Pulse 76   Ht 5' (1.524 m)   Wt 121 lb (54.9 kg)   BMI 23.63 kg/m   Constitutional: generally well-appearing, no acute distress Psychiatric: alert and oriented x3, cooperative Eyes: extraocular movements intact, anicteric, conjunctiva pink Mouth: oral pharynx moist, no lesions Neck: supple no lymphadenopathy Cardiovascular: heart regular rate and rhythm, no murmur Lungs: clear to auscultation bilaterally Abdomen: soft, very mild left lower quadrant discomfort to palpation, nondistended, no obvious ascites, no peritoneal signs, normal bowel sounds, no organomegaly Rectal: Deferred until colonoscopy Extremities: no clubbing, cyanosis, or lower extremity edema bilaterally Skin: no lesions on visible extremities Neuro: No focal deficits.  Cranial nerves intact  ASSESSMENT:  1.  Recurrent diverticulitis with small diverticular abscess and recent hospitalization as described.  Just completed course of antibiotics.  Did have diarrhea but this has resolved.  Suspect antibiotic related.  Some mild residual discomfort on exam.  Previous inpatient surgical evaluation.  Last colonoscopy 10 years ago   PLAN:  1.  Repeat CT scan of the abdomen and pelvis to assess for resolution of diverticular abscess 2.  If negative plan on colonoscopy in about 4 to 6 weeks.The nature of the procedure, as well as the risks, benefits, and alternatives were carefully and thoroughly reviewed with  the patient. Ample time for discussion and questions allowed. The patient understood, was satisfied, and agreed to proceed. 3.  Surgical follow-up thereafter

## 2019-01-21 ENCOUNTER — Ambulatory Visit (INDEPENDENT_AMBULATORY_CARE_PROVIDER_SITE_OTHER)
Admission: RE | Admit: 2019-01-21 | Discharge: 2019-01-21 | Disposition: A | Discharge status: Home / Self Care | Payer: Federal, State, Local not specified - PPO | Source: Ambulatory Visit | Attending: Internal Medicine | Admitting: Internal Medicine

## 2019-01-21 DIAGNOSIS — K5732 Diverticulitis of large intestine without perforation or abscess without bleeding: Secondary | ICD-10-CM

## 2019-01-21 MED ORDER — IOPAMIDOL (ISOVUE-300) INJECTION 61%
100.0000 mL | Freq: Once | INTRAVENOUS | Status: AC | PRN
  Administered 2019-01-21: 100 mL via INTRAVENOUS

## 2019-02-03 ENCOUNTER — Telehealth: Payer: Self-pay | Admitting: Internal Medicine

## 2019-02-03 NOTE — Telephone Encounter (Signed)
Pt has upcoming colon 2.25.20 w Dr. Henrene Pastor.  Pt called to inform that she saw her PCP last Thurs 2.13.20 for a resp infection.  Pt has finished course of antibiotics but still has slight congestion and coughing.  OK to proceed with colonoscopy?

## 2019-02-03 NOTE — Telephone Encounter (Signed)
Pt was seen by PCP last week on 2/13 for URI/sinusitus. States she was given an antibiotic and cough syrup to take. She has finished meds and has no fever but does has a residual cough, states has not been a productive cough for the past 2 days. Pt wants to make sure Dr. Henrene Pastor is ok with her still having procedure done 2/25. Please advise.

## 2019-02-03 NOTE — Telephone Encounter (Signed)
Okay to proceed based on the provided information

## 2019-02-03 NOTE — Telephone Encounter (Signed)
Spoke with pt and she is aware.

## 2019-02-05 ENCOUNTER — Telehealth: Payer: Self-pay | Admitting: Internal Medicine

## 2019-02-05 NOTE — Telephone Encounter (Signed)
Pt informed that she gets nauseous from anesthesia.  Please advise.

## 2019-02-06 NOTE — Telephone Encounter (Signed)
Lm on vm that Propofol does not usually make people nauseous but if she had concerns she could just speak to the nurse when she arrived for her procedure.

## 2019-02-10 ENCOUNTER — Encounter: Payer: Self-pay | Admitting: Internal Medicine

## 2019-02-10 ENCOUNTER — Ambulatory Visit (AMBULATORY_SURGERY_CENTER): Payer: Federal, State, Local not specified - PPO | Admitting: Internal Medicine

## 2019-02-10 VITALS — BP 117/64 | HR 72 | Temp 97.5°F | Resp 21 | Ht 60.0 in | Wt 121.0 lb

## 2019-02-10 DIAGNOSIS — D122 Benign neoplasm of ascending colon: Secondary | ICD-10-CM | POA: Diagnosis present

## 2019-02-10 DIAGNOSIS — Z1211 Encounter for screening for malignant neoplasm of colon: Secondary | ICD-10-CM | POA: Diagnosis not present

## 2019-02-10 DIAGNOSIS — R935 Abnormal findings on diagnostic imaging of other abdominal regions, including retroperitoneum: Secondary | ICD-10-CM

## 2019-02-10 DIAGNOSIS — K5732 Diverticulitis of large intestine without perforation or abscess without bleeding: Secondary | ICD-10-CM

## 2019-02-10 DIAGNOSIS — D125 Benign neoplasm of sigmoid colon: Secondary | ICD-10-CM | POA: Diagnosis not present

## 2019-02-10 MED ORDER — SODIUM CHLORIDE 0.9 % IV SOLN: 500.0000 mL | Freq: Once | INTRAVENOUS | Status: DC

## 2019-02-10 NOTE — Patient Instructions (Signed)
Thank you for allowing [place] to care for you today!  Await pathology results by mail, approximately 2 weeks.  Recommendation for next colonoscopy will be made at that time.  Resume previous diet and medications today.  Return to normal activities tomorrow.    Follow-up with general surgery.  [id number]      YOU HAD AN ENDOSCOPIC PROCEDURE TODAY AT [facility]:   Refer to the procedure report that was given to you for any specific questions about what was found during the examination.  If the procedure report does not answer your questions, please call your gastroenterologist to clarify.  If you requested that your care partner not be given the details of your procedure findings, then the procedure report has been included in a sealed envelope for you to review at your convenience later.  YOU SHOULD EXPECT: Some feelings of bloating in the abdomen. Passage of more gas than usual.  Walking can help get rid of the air that was put into your GI tract during the procedure and reduce the bloating. If you had a lower endoscopy (such as a colonoscopy or flexible sigmoidoscopy) you may notice spotting of blood in your stool or on the toilet paper. If you underwent a bowel prep for your procedure, you may not have a normal bowel movement for a few days.  Please Note:  You might notice some irritation and congestion in your nose or some drainage.  This is from the oxygen used during your procedure.  There is no need for concern and it should clear up in a day or so.  SYMPTOMS TO REPORT IMMEDIATELY:   Following lower endoscopy (colonoscopy or flexible sigmoidoscopy):  Excessive amounts of blood in the stool  Significant tenderness or worsening of abdominal pains  Swelling of the abdomen that is new, acute  Fever of 100F or higher    For urgent or emergent issues, a gastroenterologist can be reached at any hour by calling [phone].   DIET:  We do recommend a small meal at  first, but then you may proceed to your regular diet.  Drink plenty of fluids but you should avoid alcoholic beverages for 24 hours.  ACTIVITY:  You should plan to take it easy for the rest of today and you should NOT DRIVE or use heavy machinery until tomorrow (because of the sedation medicines used during the test).    FOLLOW UP: Our staff will call the number listed on your records the next business day following your procedure to check on you and address any questions or concerns that you may have regarding the information given to you following your procedure. If we do not reach you, we will leave a message.  However, if you are feeling well and you are not experiencing any problems, there is no need to return our call.  We will assume that you have returned to your regular daily activities without incident.  If any biopsies were taken you will be contacted by phone or by letter within the next 1-3 weeks.  Please call us at [phone] if you have not heard about the biopsies in 3 weeks.    SIGNATURES/CONFIDENTIALITY: You and/or your care partner have signed paperwork which will be entered into your electronic medical record.  These signatures attest to the fact that that the information above on your After Visit Summary has been reviewed and is understood.  Full responsibility of the confidentiality of this discharge information lies with you and/or  your care-partner. 

## 2019-02-10 NOTE — Progress Notes (Signed)
Called to room to assist during endoscopic procedure.  Patient ID and intended procedure confirmed with present staff. Received instructions for my participation in the procedure from the performing physician.  

## 2019-02-10 NOTE — Progress Notes (Signed)
Pt awake. VSS. Report given to RN. No anesthetic complicaitons

## 2019-02-10 NOTE — Op Note (Signed)
[facility] Patient Name: Stephanie Hoover Procedure Date: 02/10/2019 2:25 PM MRN: [id number] Endoscopist: Docia Chuck. Henrene Pastor , MD Age: 63 Referring MD:  Date of Birth: March 27, 1956 Gender: Female Account #: [id number] Procedure:                Colonoscopy with cold snare polypectomy x 3 Indications:              Screening for colorectal malignant neoplasm.                            Previous examination elsewhere 2008- for neoplasia.                            Recent hospitalization with diverticular abscess                            successfully treated medically. Medicines:                Monitored Anesthesia Care Procedure:                Pre-Anesthesia Assessment:                           - Prior to the procedure, a History and Physical                            was performed, and patient medications and                            allergies were reviewed. The patient's tolerance of                            previous anesthesia was also reviewed. The risks                            and benefits of the procedure and the sedation                            options and risks were discussed with the patient.                            All questions were answered, and informed consent                            was obtained. Prior Anticoagulants: The patient has                            taken no previous anticoagulant or antiplatelet                            agents. ASA Grade Assessment: II - A patient with                            mild systemic disease. After reviewing the risks  and benefits, the patient was deemed in                            satisfactory condition to undergo the procedure.                           After obtaining informed consent, the colonoscope                            was passed under direct vision. Throughout the                            procedure, the patient's blood pressure, pulse, and                            oxygen  saturations were monitored continuously. The                            Model PCF-[id number]) scope was introduced                            through the anus and advanced to the the cecum,                            identified by appendiceal orifice and ileocecal                            valve. The ileocecal valve, appendiceal orifice,                            and rectum were photographed. The quality of the                            bowel preparation was excellent. The colonoscopy                            was performed without difficulty. The patient                            tolerated the procedure well. The bowel preparation                            used was SUPREP. Scope In: 2:33:26 PM Scope Out: 2:51:11 PM Scope Withdrawal Time: 0 hours 12 minutes 1 second  Total Procedure Duration: 0 hours 17 minutes 45 seconds  Findings:                 Three polyps were found in the sigmoid colon and                            ascending colon. The polyps were 1 to 3 mm in size.                            These polyps were removed with a cold  snare.                            Resection and retrieval were complete.                           Multiple diverticula were found in the sigmoid                            colon and ascending colon.                           The exam was otherwise without abnormality on                            direct and retroflexion views. Complications:            No immediate complications. Estimated blood loss:                            None. Estimated Blood Loss:     Estimated blood loss: none. Impression:               - Three 1 to 3 mm polyps in the sigmoid colon and                            in the ascending colon, removed with a cold snare.                            Resected and retrieved.                           - Diverticulosis in the sigmoid colon and in the                            ascending colon.                           - The  examination was otherwise normal on direct                            and retroflexion views. Recommendation:           - Repeat colonoscopy in 5-10 years for surveillance.                           - Patient has a contact number available for                            emergencies. The signs and symptoms of potential                            delayed complications were discussed with the                            patient. Return to normal activities tomorrow.  Written discharge instructions were provided to the                            patient.                           - Resume previous diet.                           - Continue present medications.                           - Await pathology results.                           - Follow-up with general surgery [place]. Henrene Pastor, MD 02/10/2019 2:58:24 PM This report has been signed electronically.

## 2019-02-16 ENCOUNTER — Encounter: Payer: Self-pay | Admitting: Internal Medicine

## 2019-06-10 ENCOUNTER — Ambulatory Visit: Payer: Self-pay | Admitting: Surgery

## 2019-06-10 NOTE — H&P (Signed)
Stephanie Hoover Documented: 06/10/2019 2:05 PM Location: [facility] Patient #: [id number] DOB: 06/11/1956 Married / Language: English / Race: White Female  History of Present Illness Adin Hector MD; 06/10/2019 4:04 PM) The patient is a 63 year old female who presents with diverticulitis. Note for "Diverticulitis": ` ` ` Patient sent for surgical consultation at the request of Dr Scarlette Shorts  Chief Complaint: Recurrent diverticulitis ` ` The patient is a pleasant woman that has had irregular bowels most of her life. Alternating constipation and diarrhea. She had a screening colonoscopy around age 7. She recalls an episode of diverticulitis back then. As in [facility] and used to live in [place]. However, she works up in [place] and spends half the week up here. She's had care with [facility] Dr. Henrene Pastor. She had another attack of diverticulitis in 2015. She had a flare in October 2019. Eventually got better with antibiotics but had diarrhea on the Augmentin. She had another attack in December. Did not get better on oral antibiotics. Felt worse. Was admitted and placed on IV antibiotics and she had a contained abscess. She slowly improved. Her condition remained to consider colonic resection. She had a follow-up colonoscopy done by Dr. Henrene Pastor and February which disproved any intraluminal tumor. Just a few polyps. If the diverticula in sigmoid colon with some thickening but no stricture. She is due to follow up with our group in March to discuss elective colon resection but to the Covid pandemic this was postponed until today.  She comes today by herself. She has not had any definite attacks of diverticulitis since the February. She does get some intermittent crampy abdominal pain. Usually moves her bowels about every 2-3 days. Occasional loose bowel movements as well. Takes probiotics, magnesium, some fiber. Its bed diarrhea and nausea with  oral Augmentin, Cipro, metronidazole. She had a hysterectomy at age 52 with an appendectomy. No other abdominal surgery. She does not smoke. She is not diabetic. She can walk at least an hour without difficulty. She's had some incidental gallstones noted but does not have any history of postprandial nausea vomiting or upper abdominal pain. She recalls her sister had recurrent diverticulitis and required an open colon resection. She would like to avoid an open resection. She's think she's had IBS for most of her life  No personal nor family history of GI/colon cancer, inflammatory bowel disease, allergy such as Celiac Sprue, dietary/dairy problems, colitis, ulcers nor gastritis. No recent sick contacts/gastroenteritis. No travel outside the country. No changes in diet. No dysphagia to solids or liquids. No significant heartburn or reflux. No hematochezia, hematemesis, coffee ground emesis. No evidence of prior gastric/peptic ulceration.  (Review of systems as stated in this history (HPI) or in the review of systems. Otherwise all other 12 point ROS are negative) ` ` `   Past Surgical History Andreas Blower, CMA; 06/10/2019 2:06 PM) Appendectomy Breast Biopsy Left. Colon Polyp Removal - Colonoscopy Hysterectomy (not due to cancer) - Complete  Diagnostic Studies History (Armen Ferguson, [place]; 06/10/2019 2:06 PM) Colonoscopy within last year Mammogram 1-3 years ago Pap Smear >5 years ago  Allergies Andreas Blower, CMA; 06/10/2019 2:08 PM) Codeine Phosphate *ANALGESICS - OPIOID* Morphine Sulfate (Concentrate) *ANALGESICS - OPIOID* Promethazine HCl *ANTIHISTAMINES* Meperidine HCl *ANALGESICS - OPIOID* sulfADIAZINE *SULFONAMIDES* Phenergan *ANTIHISTAMINES*  Medication History (Armen Ferguson, CMA; 06/10/2019 2:10 PM) Estradiol (0.5MG  Tablet, Oral) Active. Hyoscyamine Sulfate (0.125MG  Tab Sublingual, Sublingual) Active. Flonase (50MCG/ACT Suspension, Nasal)  Active. Bentyl (20MG  Tablet, Oral) Active. Claritin (  10MG  Tablet, Oral) Active. Medications Reconciled  Social History Andreas Blower, [place]; 06/10/2019 2:06 PM) Alcohol use Occasional alcohol use. Caffeine use Coffee, Tea. No drug use Tobacco use Never smoker.  Family History Andreas Blower, [place]; 06/10/2019 2:06 PM) Arthritis Father, Mother. Colon Polyps Sister. Thyroid problems Sister.  Pregnancy / Birth History Andreas Blower, [place]; 06/10/2019 2:06 PM) Age of menopause <45 Gravida 2 Maternal age 13-25 Para 2  Other Problems Andreas Blower, [place]; 06/10/2019 2:06 PM) Arthritis Cholelithiasis General anesthesia - complications Hypercholesterolemia Kidney Stone Oophorectomy     Review of Systems (Armen Ferguson CMA; 06/10/2019 2:06 PM) Breast Not Present- Breast Mass, Breast Pain, Nipple Discharge and Skin Changes. Musculoskeletal Not Present- Back Pain, Joint Pain, Joint Stiffness, Muscle Pain, Muscle Weakness and Swelling of Extremities.  Vitals (Armen Ferguson CMA; 06/10/2019 2:07 PM) 06/10/2019 2:07 PM Weight: 131.38 lb Height: 60.5in Body Surface Area: 1.57 m Body Mass Index: 25.23 kg/m  Temp.: 98.82F  Pulse: 95 (Regular)  P.OX: 98% (Room air) BP: 104/70 (Sitting, Left Arm, Standard)        Physical Exam Adin Hector MD; 06/10/2019 3:00 PM)  General Mental Status-Alert. General Appearance-Not in acute distress, Not Sickly. Orientation-Oriented X3. Hydration-Well hydrated. Voice-Normal.  Integumentary Global Assessment Upon inspection and palpation of skin surfaces of the - Axillae: non-tender, no inflammation or ulceration, no drainage. and Distribution of scalp and body hair is normal. General Characteristics Temperature - normal warmth is noted.  Head and Neck Head-normocephalic, atraumatic with no lesions or palpable masses. Face Global Assessment - atraumatic, no absence of expression. Neck Global  Assessment - no abnormal movements, no bruit auscultated on the right, no bruit auscultated on the left, no decreased range of motion, non-tender. Trachea-midline. Thyroid Gland Characteristics - non-tender.  Eye Eyeball - Left-Extraocular movements intact, No Nystagmus - Left. Eyeball - Right-Extraocular movements intact, No Nystagmus - Right. Cornea - Left-No Hazy - Left. Cornea - Right-No Hazy - Right. Sclera/Conjunctiva - Left-No scleral icterus, No Discharge - Left. Sclera/Conjunctiva - Right-No scleral icterus, No Discharge - Right. Pupil - Left-Direct reaction to light normal. Pupil - Right-Direct reaction to light normal.  ENMT Ears Pinna - Left - no drainage observed, no generalized tenderness observed. Pinna - Right - no drainage observed, no generalized tenderness observed. Nose and Sinuses External Inspection of the Nose - no destructive lesion observed. Inspection of the nares - Left - quiet respiration. Inspection of the nares - Right - quiet respiration. Mouth and Throat Lips - Upper Lip - no fissures observed, no pallor noted. Lower Lip - no fissures observed, no pallor noted. Nasopharynx - no discharge present. Oral Cavity/Oropharynx - Tongue - no dryness observed. Oral Mucosa - no cyanosis observed. Hypopharynx - no evidence of airway distress observed.  Chest and Lung Exam Inspection Movements - Normal and Symmetrical. Accessory muscles - No use of accessory muscles in breathing. Palpation Palpation of the chest reveals - Non-tender. Auscultation Breath sounds - Normal and Clear.  Cardiovascular Auscultation Rhythm - Regular. Murmurs & Other Heart Sounds - Auscultation of the heart reveals - No Murmurs and No Systolic Clicks.  Abdomen Inspection Inspection of the abdomen reveals - No Visible peristalsis and No Abnormal pulsations. Umbilicus - No Bleeding, No Urine drainage. Palpation/Percussion Palpation and Percussion of the abdomen reveal  - Soft, Non Tender, No Rebound tenderness, No Rigidity (guarding) and No Cutaneous hyperesthesia. Note: Abdomen soft. Not severely distended. No diastasis recti. No umbilical or other anterior abdominal wall hernias  Female Genitourinary Sexual Maturity Tanner 5 -  Adult hair pattern. Note: No vaginal bleeding nor discharge. Well-healed Pfannenstiel incision. No inguinal hernias. She points to left suprapubic region for a usual area of diverticular pain. She does not have pain today.  Peripheral Vascular Upper Extremity Inspection - Left - No Cyanotic nailbeds - Left, Not Ischemic. Inspection - Right - No Cyanotic nailbeds - Right, Not Ischemic.  Neurologic Neurologic evaluation reveals -normal attention span and ability to concentrate, able to name objects and repeat phrases. Appropriate fund of knowledge , normal sensation and normal coordination. Mental Status Affect - not angry, not paranoid. Cranial Nerves-Normal Bilaterally. Gait-Normal.  Neuropsychiatric Mental status exam performed with findings of-able to articulate well with normal speech/language, rate, volume and coherence, thought content normal with ability to perform basic computations and apply abstract reasoning and no evidence of hallucinations, delusions, obsessions or homicidal/suicidal ideation.  Musculoskeletal Global Assessment Spine, Ribs and Pelvis - no instability, subluxation or laxity. Right Upper Extremity - no instability, subluxation or laxity.  Lymphatic Head & Neck  General Head & Neck Lymphatics: Bilateral - Description - No Localized lymphadenopathy. Axillary  General Axillary Region: Bilateral - Description - No Localized lymphadenopathy. Femoral & Inguinal  Generalized Femoral & Inguinal Lymphatics: Left - Description - No Localized lymphadenopathy. Right - Description - No Localized lymphadenopathy.    Assessment & Plan Adin Hector MD; 06/10/2019 4:05 PM)  DIVERTICULITIS  OF LARGE INTESTINE WITHOUT PERFORATION OR ABSCESS WITHOUT BLEEDING (K57.32) Impression: Recurrent flares of diverticulitis at age 63, 2015, and 2019  2. Last episode with an abscess requiring IV antibiotics. She feels like she's had more than 4 attacks. Colonoscopy in February not showing any major stricture or tumor.  She struggles with diarrhea and poor compliance on oral Augmentin or Cipro and Flagyl. She is tired of the antibiotic regimens.  I think she would benefit from considering segmental colonic resection to break the cycle of recurrent infections. Robotic sigmoid colectomy. Her older sister had to have open surgery for this and she like to consider a minimally invasive approach, liking the idea of robotic resection through her Pfannenstiel incision. She is leaning toward surgery but she wants to discuss with her husband first. She was thinking of waiting until September which is not unreasonable since she hasn't had any flares in the past 2 months. However, the longer she waits, there is an increase of recurrent attacks. She will discuss with her husband and let us know. I told her I would start the process of scheduling just in case  Current Plans You are being scheduled for surgery- Our schedulers will call you.  You should hear from our office's scheduling department within 5 working days about the location, date, and time of surgery. We try to make accommodations for patient's preferences in scheduling surgery, but sometimes the OR schedule or the surgeon's schedule prevents [place] from making those accommodations.  If you have not heard from our office [phone]) in 5 working days, call the office and ask for your surgeon's nurse.  If you have other questions about your diagnosis, plan, or surgery, call the office and ask for your surgeon's nurse.  Pt Education - CCS Diverticular Disease (AT)  PREOP COLON - ENCOUNTER FOR PREOPERATIVE EXAMINATION FOR GENERAL SURGICAL PROCEDURE  (Z01.818)  Current Plans Written instructions provided The anatomy & physiology of the digestive tract was discussed. The pathophysiology of the colon was discussed. Natural history risks without surgery was discussed. I feel the risks of no intervention will lead to serious problems that outweigh  the operative risks; therefore, I recommended a partial colectomy to remove the pathology. Minimally invasive (Robotic/Laparoscopic) & open techniques were discussed.  Risks such as bleeding, infection, abscess, leak, reoperation, possible ostomy, hernia, heart attack, death, and other risks were discussed. I noted a good likelihood this will help address the problem. Goals of post-operative recovery were discussed as well. Need for adequate nutrition, daily bowel regimen and healthy physical activity, to optimize recovery was noted as well. We will work to minimize complications. Educational materials were available as well. Questions were answered. The patient expresses understanding & wishes to proceed with surgery.  Pt Education - CCS Colon Bowel Prep 2018 ERAS/Miralax/Antibiotics Started Neomycin Sulfate 500 MG Oral Tablet, 2 (two) Tablet SEE NOTE, #6, 06/10/2019, No Refill. Local Order: Pharmacist Notes: TAKE TWO TABLETS AT 2 PM, 3 PM, AND 10 PM THE DAY PRIOR TO SURGERY Started Flagyl 500 MG Oral Tablet, 2 (two) Tablet SEE NOTE, #6, 06/10/2019, No Refill. Local Order: Pharmacist Notes: Take at 2pm, 3pm, and 10pm the day prior to your colon operation Pt Education - Pamphlet Given - Laparoscopic Colorectal Surgery: discussed with patient and provided information. Pt Education - CCS Colectomy post-op instructions: discussed with patient and provided information.  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 I'm skeptical that this will markedly change after doing surgery for diverticulitis. Regardless, I  think she would benefit from a fiber bowel regimen.  Current Plans Pt Education - CCS Good Bowel Health (Garrell Flagg)  Adin Hector, MD, FACS, MASCRS Gastrointestinal and Minimally Invasive Surgery    [address], [place], [place] [address] [phone] Main / Paging [phone] Fax

## 2019-08-06 ENCOUNTER — Ambulatory Visit: Payer: Self-pay | Admitting: Surgery

## 2019-08-31 ENCOUNTER — Other Ambulatory Visit (INDEPENDENT_AMBULATORY_CARE_PROVIDER_SITE_OTHER): Payer: Federal, State, Local not specified - PPO

## 2019-08-31 ENCOUNTER — Other Ambulatory Visit: Payer: Self-pay

## 2019-08-31 ENCOUNTER — Telehealth: Payer: Self-pay | Admitting: Internal Medicine

## 2019-08-31 DIAGNOSIS — R1032 Left lower quadrant pain: Secondary | ICD-10-CM

## 2019-08-31 LAB — CREATININE, SERUM: Creatinine, Ser: 0.75 mg/dL (ref 0.40–1.20)

## 2019-08-31 LAB — BUN: BUN: 11 mg/dL (ref 6–23)

## 2019-08-31 NOTE — Telephone Encounter (Signed)
Stephanie Hoover, 1.  Contrast-enhanced CT scan of the abdomen and pelvis to rule out recurrent diverticulitis 2.  If positive for diverticulitis she will need antibiotics (and possibly rescheduling of her surgery).  I would recommend Augmentin 875 mg twice daily for 10 days, if needed.  This is a different antibiotic than she had previously and it might be better tolerated.  If the CT is negative, then she may have diverticular pain without diverticulitis in which case we could recommend antispasmodic such as Bentyl 3.  Her surgical team needs to be updated in this regard.  It looks like she has surgery scheduled in 2 weeks.  I will send a copy of this to Dr. Johney Maine to keep him in the loop Dr. Henrene Pastor

## 2019-08-31 NOTE — Telephone Encounter (Signed)
Pt is due to have surgery 9/30 to have part of colon removed. She thinks she is having a diverticulitis flare, reports she has LLQ abd pain, she does not have a fever but states she usually doesn't have one, and when she has a BM she has pain all across her abd. Reports when she is placed on antibiotics they usually mess her stomach up and cause terrible diarrhea. She wants to know what Dr. Henrene Pastor feels she needs to do. Please advise.

## 2019-08-31 NOTE — Telephone Encounter (Signed)
Pt states that she is having a diverticulitis flare up. Pls call her.

## 2019-08-31 NOTE — Telephone Encounter (Signed)
Pt scheduled for CT of A/P at [facility] 09/01/19@7am , pt to arrive there at 6:45am. Pt to be NPO after midnight, drink bottle 1 of contrast at 5am, bottle 2 at 6am. Pt will come for labs today and pick up contrast. Pt aware.

## 2019-09-01 ENCOUNTER — Other Ambulatory Visit: Payer: Self-pay

## 2019-09-01 ENCOUNTER — Encounter (HOSPITAL_COMMUNITY): Payer: Self-pay

## 2019-09-01 ENCOUNTER — Ambulatory Visit (HOSPITAL_COMMUNITY)
Admission: RE | Admit: 2019-09-01 | Discharge: 2019-09-01 | Disposition: A | Discharge status: Home / Self Care | Payer: Federal, State, Local not specified - PPO | Source: Ambulatory Visit | Attending: Internal Medicine | Admitting: Internal Medicine

## 2019-09-01 DIAGNOSIS — R1032 Left lower quadrant pain: Secondary | ICD-10-CM | POA: Diagnosis present

## 2019-09-01 MED ORDER — IOHEXOL 300 MG/ML  SOLN
100.0000 mL | Freq: Once | INTRAMUSCULAR | Status: AC | PRN
  Administered 2019-09-01: 100 mL via INTRAVENOUS

## 2019-09-01 MED ORDER — AMOXICILLIN-POT CLAVULANATE 875-125 MG PO TABS: 1.0000 | ORAL_TABLET | Freq: Two times a day (BID) | ORAL | 0 refills | Status: DC

## 2019-09-01 MED ORDER — SODIUM CHLORIDE (PF) 0.9 % IJ SOLN
INTRAMUSCULAR | Status: AC
  Filled 2019-09-01: qty 50

## 2019-09-02 ENCOUNTER — Telehealth: Payer: Self-pay | Admitting: Internal Medicine

## 2019-09-02 MED ORDER — AMOXICILLIN-POT CLAVULANATE 875-125 MG PO TABS: 1.0000 | ORAL_TABLET | Freq: Two times a day (BID) | ORAL | 0 refills | Status: DC

## 2019-09-02 NOTE — Telephone Encounter (Signed)
See result note.  

## 2019-09-04 NOTE — Patient Instructions (Addendum)
DUE TO COVID-19 ONLY ONE VISITOR IS ALLOWED TO COME WITH YOU AND STAY IN THE WAITING ROOM ONLY DURING PRE OP AND PROCEDURE DAY OF SURGERY.  THE 1 VISITOR MAY VISIT WITH YOU AFTER SURGERY IN YOUR PRIVATE ROOM DURING VISITING HOURS ONLY!    YOU NEED TO HAVE A COVID 19 TEST ON__Sat. 9/26_____ @__12 :00_____, THIS TEST MUST BE DONE BEFORE SURGERY, COME  [address] , [id number].  ([place])  ONCE YOUR COVID TEST IS COMPLETED, PLEASE BEGIN THE QUARANTINE INSTRUCTIONS AS OUTLINED IN YOUR HANDOUT.                Lorma Cannan   Your procedure is scheduled on: Wednesday 09/16/19   Report to [facility] Main  Entrance  Report to admitting  6:30 at AM     Call this number if you have problems the morning of surgery [place], NO CHEWING GUM [place].  YOU ARE ENCOURAGED TO DRINK FLUIDS THE DAY BEFORE SURGERY TO PREVENT DEHYDRATION FROM THE BOWEL PREP. Follow the instructions from Dr. Clyda Greener office for bowel prep   DRINK 2 PRESURGERY ENSURE DRINKS THE NIGHT BEFORE SURGERY AT  1000 PM .  NO SOLIDS AFTER MIDNIGHT THE DAY PRIOR TO THE SURGERY.   NOTHING BY MOUTH EXCEPT CLEAR LIQUIDS UNTIL 6:00 am   CLEAR LIQUID DIET   Foods Allowed                                                                     Foods Excluded  Coffee and tea, regular and decaf                             liquids that you cannot  Plain Jell-O any favor except red or purple                                           see through such as: Fruit ices (not with fruit pulp)                                     milk, soups, orange juice  Iced Popsicles                                    All solid food Carbonated beverages, regular and diet                                    Cranberry, grape and apple juices Sports drinks like Gatorade Lightly seasoned clear broth or consume(fat free) Sugar, honey syrup  At 6:30  am FINISH THE INSURE DRINK THEN NOTHING BY MOUTH    Take these medicines the morning of surgery with A SIP OF WATER:  Claritin, Flonase  You may not have any metal on your body including hair pins and              piercings            Do not wear jewelry, make-up, lotions, powders or perfumes, deodorant             Do not wear nail polish.  Do not shave  48 hours prior to surgery.             Do not bring valuables to the hospital. [place].  Contacts, dentures or bridgework may not be worn into surgery.                   Please read over the following fact sheets you were given: _____________________________________________________________________             [facility] - Preparing for Surgery  Before surgery, you can play an important role.   Because skin is not sterile, your skin needs to be as free of germs as possible.   You can reduce the number of germs on your skin by washing with CHG (chlorahexidine gluconate) soap before surgery.   CHG is an antiseptic cleaner which kills germs and bonds with the skin to continue killing germs even after washing. Please DO NOT use if you have an allergy to CHG or antibacterial soaps.   If your skin becomes reddened/irritated stop using the CHG and inform your nurse when you arrive at Short Stay. Do not shave (including legs and underarms) for at least 48 hours prior to the first CHG shower.   Please follow these instructions carefully:  1.  Shower with CHG Soap the night before surgery and the  morning of Surgery.  2.  If you choose to wash your hair, wash your hair first as usual with your  normal  shampoo.  3.  After you shampoo, rinse your hair and body thoroughly to remove the  shampoo.                                        4.  Use CHG as you would any other liquid soap.  You can apply chg directly  to the skin and wash                       Gently  with a scrungie or clean washcloth.  5.  Apply the CHG Soap to your body ONLY FROM THE NECK DOWN.   Do not use on face/ open                           Wound or open sores. Avoid contact with eyes, ears mouth and genitals (private parts).                       Wash face,  Genitals (private parts) with your normal soap.             6.  Wash thoroughly, paying special attention to the area where your surgery  will be performed.  7.  Thoroughly rinse your body with warm water from the neck down.  8.  DO NOT shower/wash with your normal soap after  using and rinsing off  the CHG Soap.                9.  Pat yourself dry with a clean towel.            10.  Wear clean pajamas.            11.  Place clean sheets on your bed the night of your first shower and do not  sleep with pets. Day of Surgery : Do not apply any lotions/deodorants the morning of surgery.  Please wear clean clothes to the hospital/surgery center.  FAILURE TO FOLLOW THESE INSTRUCTIONS MAY RESULT IN THE CANCELLATION OF YOUR SURGERY PATIENT SIGNATURE_________________________________  NURSE SIGNATURE__________________________________  ________________________________________________________________________   Adam Phenix  An incentive spirometer is a tool that can help keep your lungs clear and active. This tool measures how well you are filling your lungs with each breath. Taking long deep breaths may help reverse or decrease the chance of developing breathing (pulmonary) problems (especially infection) following:  A long period of time when you are unable to move or be active. BEFORE THE PROCEDURE   If the spirometer includes an indicator to show your best effort, your nurse or respiratory therapist will set it to a desired goal.  If possible, sit up straight or lean slightly forward. Try not to slouch.  Hold the incentive spirometer in an upright position. INSTRUCTIONS FOR USE  1. Sit on the edge of your bed if  possible, or sit up as far as you can in bed or on a chair. 2. Hold the incentive spirometer in an upright position. 3. Breathe out normally. 4. Place the mouthpiece in your mouth and seal your lips tightly around it. 5. Breathe in slowly and as deeply as possible, raising the piston or the ball toward the top of the column. 6. Hold your breath for 3-5 seconds or for as long as possible. Allow the piston or ball to fall to the bottom of the column. 7. Remove the mouthpiece from your mouth and breathe out normally. 8. Rest for a few seconds and repeat Steps 1 through 7 at least 10 times every 1-2 hours when you are awake. Take your time and take a few normal breaths between deep breaths. 9. The spirometer may include an indicator to show your best effort. Use the indicator as a goal to work toward during each repetition. 10. After each set of 10 deep breaths, practice coughing to be sure your lungs are clear. If you have an incision (the cut made at the time of surgery), support your incision when coughing by placing a pillow or rolled up towels firmly against it. Once you are able to get out of bed, walk around indoors and cough well. You may stop using the incentive spirometer when instructed by your caregiver.  RISKS AND COMPLICATIONS  Take your time so you do not get dizzy or light-headed.  If you are in pain, you may need to take or ask for pain medication before doing incentive spirometry. It is harder to take a deep breath if you are having pain. AFTER USE  Rest and breathe slowly and easily.  It can be helpful to keep track of a log of your progress. Your caregiver can provide you with a simple table to help with this. If you are using the spirometer at home, follow these instructions: [place] IF:   You are having difficultly using the spirometer.  You have trouble  using the spirometer as often as instructed.  Your pain medication is not giving enough relief while using  the spirometer.  You develop fever of 100.5 F (38.1 C) or higher. SEEK IMMEDIATE MEDICAL CARE IF:   You cough up bloody sputum that had not been present before.  You develop fever of 102 F (38.9 C) or greater.  You develop worsening pain at or near the incision site. MAKE SURE YOU:   Understand these instructions.  Will watch your condition.  Will get help right away if you are not doing well or get worse. Document Released: 04/15/2007 Document Revised: 02/25/2012 Document Reviewed: 06/16/2007 [facility] Patient Information 2014 [place], [place].   ________________________________________________________________________

## 2019-09-07 ENCOUNTER — Encounter (HOSPITAL_COMMUNITY): Payer: Self-pay

## 2019-09-07 ENCOUNTER — Encounter (HOSPITAL_COMMUNITY)
Admission: RE | Admit: 2019-09-07 | Discharge: 2019-09-07 | Disposition: A | Discharge status: Home / Self Care | Payer: Federal, State, Local not specified - PPO | Source: Ambulatory Visit | Attending: Surgery | Admitting: Surgery

## 2019-09-07 ENCOUNTER — Other Ambulatory Visit: Payer: Self-pay

## 2019-09-07 DIAGNOSIS — Z01812 Encounter for preprocedural laboratory examination: Secondary | ICD-10-CM | POA: Insufficient documentation

## 2019-09-07 HISTORY — DX: Other complications of anesthesia, initial encounter: T88.59XA

## 2019-09-07 HISTORY — DX: Personal history of urinary calculi: Z87.442

## 2019-09-07 HISTORY — DX: Other specified postprocedural states: Z98.890

## 2019-09-07 HISTORY — DX: Other specified postprocedural states: R11.2

## 2019-09-07 LAB — BASIC METABOLIC PANEL
Anion gap: 9 (ref 5–15)
BUN: 13 mg/dL (ref 8–23)
CO2: 26 mmol/L (ref 22–32)
Calcium: 9.4 mg/dL (ref 8.9–10.3)
Chloride: 104 mmol/L (ref 98–111)
Creatinine, Ser: 0.74 mg/dL (ref 0.44–1.00)
GFR calc Af Amer: >60 mL/min (ref >60)
GFR calc non Af Amer: >60 mL/min (ref >60)
Glucose, Bld: 90 mg/dL (ref 70–99)
Potassium: 4.2 mmol/L (ref 3.5–5.1)
Sodium: 139 mmol/L (ref 135–145)

## 2019-09-07 LAB — CBC
HCT: 42.1 % (ref 36.0–46.0)
Hemoglobin: 13.8 g/dL (ref 12.0–15.0)
MCH: 31.4 pg (ref 26.0–34.0)
MCHC: 32.8 g/dL (ref 30.0–36.0)
MCV: 95.9 fL (ref 80.0–100.0)
Platelets: 341 10*3/uL (ref 150–400)
RBC: 4.39 MIL/uL (ref 3.87–5.11)
RDW: 12.1 % (ref 11.5–15.5)
WBC: 5.4 10*3/uL (ref 4.0–10.5)
nRBC: 0 % (ref 0.0–0.2)

## 2019-09-07 LAB — HEMOGLOBIN A1C
Hgb A1c MFr Bld: 5.3 % (ref 4.8–5.6)
Mean Plasma Glucose: 105.41 mg/dL

## 2019-09-07 MED ORDER — ENSURE PRE-SURGERY PO LIQD
296.0000 mL | Freq: Once | ORAL | Status: DC
  Filled 2019-09-07: qty 296

## 2019-09-07 MED ORDER — ENSURE PRE-SURGERY PO LIQD
592.0000 mL | Freq: Once | ORAL | Status: DC
  Filled 2019-09-07: qty 592

## 2019-09-07 NOTE — Progress Notes (Signed)
PCP - Dr. Teresita Madura Cardiologist - none  Chest x-ray - no EKG - no Stress Test - no ECHO - no Cardiac Cath - no  Sleep Study - no CPAP - no  Fasting Blood Sugar - NA Checks Blood Sugar _____ times a day  Blood Thinner Instructions:NA Aspirin Instructions: Last Dose:  Anesthesia review:   Patient denies shortness of breath, fever, cough and chest pain at PAT appointment yes  Patient verbalized understanding of instructions that were given to them at the PAT appointment. Patient was also instructed that they will need to review over the PAT instructions again at home before surgery. Yes  Pt recieved her instructions for bowel prep in Feb. 20. She will call Dr. Clyda Greener office to ask about the antibiotics and bowel prep.

## 2019-09-12 ENCOUNTER — Other Ambulatory Visit (HOSPITAL_COMMUNITY)
Admission: RE | Admit: 2019-09-12 | Discharge: 2019-09-12 | Disposition: A | Discharge status: Home / Self Care | Payer: Federal, State, Local not specified - PPO | Source: Ambulatory Visit | Attending: Surgery | Admitting: Surgery

## 2019-09-12 DIAGNOSIS — K5792 Diverticulitis of intestine, part unspecified, without perforation or abscess without bleeding: Secondary | ICD-10-CM | POA: Insufficient documentation

## 2019-09-12 DIAGNOSIS — Z20828 Contact with and (suspected) exposure to other viral communicable diseases: Secondary | ICD-10-CM | POA: Insufficient documentation

## 2019-09-12 DIAGNOSIS — Z01812 Encounter for preprocedural laboratory examination: Secondary | ICD-10-CM | POA: Insufficient documentation

## 2019-09-13 LAB — NOVEL CORONAVIRUS, NAA (HOSP ORDER, SEND-OUT TO REF LAB; TAT 18-24 HRS): SARS-CoV-2, NAA: NOT DETECTED

## 2019-09-15 MED ORDER — SODIUM CHLORIDE 0.9 % IV SOLN
INTRAVENOUS | Status: DC
  Filled 2019-09-15: qty 6

## 2019-09-15 MED ORDER — BUPIVACAINE LIPOSOME 1.3 % IJ SUSP
20.0000 mL | INTRAMUSCULAR | Status: DC
  Filled 2019-09-15: qty 20

## 2019-09-15 NOTE — Anesthesia Preprocedure Evaluation (Addendum)
Anesthesia Evaluation  Patient identified by MRN, date of birth, ID band Patient awake    Reviewed: Allergy & Precautions, NPO status , Patient's Chart, lab work & pertinent test results  History of Anesthesia Complications (+) PONVNegative for: history of anesthetic complications  Airway Mallampati: II  TM Distance: >3 FB Neck ROM: Full    Dental no notable dental hx.    Pulmonary neg pulmonary ROS,    Pulmonary exam normal        Cardiovascular negative cardio ROS Normal cardiovascular exam     Neuro/Psych negative neurological ROS  negative psych ROS   GI/Hepatic Neg liver ROS, Diverticulitis   Endo/Other  negative endocrine ROS  Renal/GU negative Renal ROS  negative genitourinary   Musculoskeletal  (+) Arthritis ,   Abdominal   Peds  Hematology negative hematology ROS (+)   Anesthesia Other Findings Day of surgery medications reviewed with patient.  Reproductive/Obstetrics negative OB ROS                            Anesthesia Physical Anesthesia Plan  ASA: II  Anesthesia Plan: General   Post-op Pain Management:    Induction: Intravenous  PONV Risk Score and Plan: 4 or greater and Treatment may vary due to age or medical condition, Ondansetron, Dexamethasone, Midazolam, Scopolamine patch - Pre-op and Propofol infusion  Airway Management Planned: Oral ETT  Additional Equipment: None  Intra-op Plan:   Post-operative Plan: Extubation in OR  Informed Consent: I have reviewed the patients History and Physical, chart, labs and discussed the procedure including the risks, benefits and alternatives for the proposed anesthesia with the patient or authorized representative who has indicated his/her understanding and acceptance.     Dental advisory given  Plan Discussed with: CRNA  Anesthesia Plan Comments:        Anesthesia Quick Evaluation

## 2019-09-16 ENCOUNTER — Inpatient Hospital Stay (HOSPITAL_COMMUNITY): Payer: Federal, State, Local not specified - PPO | Admitting: Anesthesiology

## 2019-09-16 ENCOUNTER — Encounter (HOSPITAL_COMMUNITY): Payer: Self-pay | Admitting: General Practice

## 2019-09-16 ENCOUNTER — Inpatient Hospital Stay (HOSPITAL_COMMUNITY): Payer: Federal, State, Local not specified - PPO | Admitting: Physician Assistant

## 2019-09-16 ENCOUNTER — Encounter (HOSPITAL_COMMUNITY)
Admission: RE | Disposition: A | Discharge status: Home / Self Care | Payer: Self-pay | Source: Ambulatory Visit | Attending: Surgery

## 2019-09-16 ENCOUNTER — Other Ambulatory Visit: Payer: Self-pay

## 2019-09-16 ENCOUNTER — Inpatient Hospital Stay (HOSPITAL_COMMUNITY)
Admission: RE | Admit: 2019-09-16 | Discharge: 2019-09-20 | DRG: 331 | Disposition: A | Discharge status: Home / Self Care | Payer: Federal, State, Local not specified - PPO | Source: Ambulatory Visit | Attending: Surgery | Admitting: Surgery

## 2019-09-16 DIAGNOSIS — Z8371 Family history of colonic polyps: Secondary | ICD-10-CM | POA: Diagnosis not present

## 2019-09-16 DIAGNOSIS — K5732 Diverticulitis of large intestine without perforation or abscess without bleeding: Secondary | ICD-10-CM | POA: Diagnosis present

## 2019-09-16 DIAGNOSIS — K581 Irritable bowel syndrome with constipation: Secondary | ICD-10-CM | POA: Diagnosis present

## 2019-09-16 DIAGNOSIS — K589 Irritable bowel syndrome without diarrhea: Secondary | ICD-10-CM | POA: Diagnosis present

## 2019-09-16 DIAGNOSIS — K910 Vomiting following gastrointestinal surgery: Secondary | ICD-10-CM | POA: Diagnosis not present

## 2019-09-16 DIAGNOSIS — T4145XA Adverse effect of unspecified anesthetic, initial encounter: Secondary | ICD-10-CM | POA: Diagnosis present

## 2019-09-16 DIAGNOSIS — K5790 Diverticulosis of intestine, part unspecified, without perforation or abscess without bleeding: Secondary | ICD-10-CM | POA: Diagnosis present

## 2019-09-16 DIAGNOSIS — Z87442 Personal history of urinary calculi: Secondary | ICD-10-CM

## 2019-09-16 DIAGNOSIS — K5720 Diverticulitis of large intestine with perforation and abscess without bleeding: Secondary | ICD-10-CM

## 2019-09-16 DIAGNOSIS — J3089 Other allergic rhinitis: Secondary | ICD-10-CM | POA: Diagnosis present

## 2019-09-16 DIAGNOSIS — L209 Atopic dermatitis, unspecified: Secondary | ICD-10-CM | POA: Insufficient documentation

## 2019-09-16 DIAGNOSIS — R112 Nausea with vomiting, unspecified: Secondary | ICD-10-CM

## 2019-09-16 DIAGNOSIS — M1990 Unspecified osteoarthritis, unspecified site: Secondary | ICD-10-CM | POA: Diagnosis present

## 2019-09-16 DIAGNOSIS — Z8261 Family history of arthritis: Secondary | ICD-10-CM | POA: Diagnosis not present

## 2019-09-16 DIAGNOSIS — E876 Hypokalemia: Secondary | ICD-10-CM | POA: Diagnosis not present

## 2019-09-16 DIAGNOSIS — Z9889 Other specified postprocedural states: Secondary | ICD-10-CM

## 2019-09-16 DIAGNOSIS — T8859XA Other complications of anesthesia, initial encounter: Secondary | ICD-10-CM | POA: Diagnosis present

## 2019-09-16 SURGERY — COLECTOMY, PARTIAL, ROBOT-ASSISTED, LAPAROSCOPIC
Anesthesia: General | Site: Abdomen

## 2019-09-16 MED ORDER — ONDANSETRON HCL 4 MG/2ML IJ SOLN
4.0000 mg | Freq: Four times a day (QID) | INTRAMUSCULAR | Status: DC | PRN
  Administered 2019-09-16: 4 mg via INTRAVENOUS
  Filled 2019-09-16: qty 2

## 2019-09-16 MED ORDER — ACETAMINOPHEN 500 MG PO TABS
1000.0000 mg | ORAL_TABLET | Freq: Four times a day (QID) | ORAL | Status: DC
  Administered 2019-09-16 – 2019-09-20 (×15): 1000 mg via ORAL
  Filled 2019-09-16 (×16): qty 2

## 2019-09-16 MED ORDER — GABAPENTIN 300 MG PO CAPS
300.0000 mg | ORAL_CAPSULE | ORAL | Status: AC
  Administered 2019-09-16: 300 mg via ORAL
  Filled 2019-09-16: qty 1

## 2019-09-16 MED ORDER — LIDOCAINE 2% (20 MG/ML) 5 ML SYRINGE
INTRAMUSCULAR | Status: DC | PRN
  Administered 2019-09-16: 50 mg via INTRAVENOUS

## 2019-09-16 MED ORDER — ESTRADIOL 0.1 MG/GM VA CREA
0.5000 | TOPICAL_CREAM | VAGINAL | Status: DC
  Filled 2019-09-16: qty 42.5

## 2019-09-16 MED ORDER — CELECOXIB 200 MG PO CAPS: 200.0000 mg | ORAL_CAPSULE | ORAL | Status: DC

## 2019-09-16 MED ORDER — ACETAMINOPHEN 500 MG PO TABS: 1000.0000 mg | ORAL_TABLET | Freq: Once | ORAL | Status: DC

## 2019-09-16 MED ORDER — BUPIVACAINE LIPOSOME 1.3 % IJ SUSP
INTRAMUSCULAR | Status: DC | PRN
  Administered 2019-09-16: 20 mL

## 2019-09-16 MED ORDER — ACETAMINOPHEN 500 MG PO TABS: 1000.0000 mg | ORAL_TABLET | ORAL | Status: DC

## 2019-09-16 MED ORDER — DIPHENHYDRAMINE HCL 50 MG/ML IJ SOLN: 12.5000 mg | Freq: Four times a day (QID) | INTRAMUSCULAR | Status: DC | PRN

## 2019-09-16 MED ORDER — ENOXAPARIN SODIUM 40 MG/0.4ML ~~LOC~~ SOLN
40.0000 mg | SUBCUTANEOUS | Status: DC
  Administered 2019-09-17 – 2019-09-20 (×4): 40 mg via SUBCUTANEOUS
  Filled 2019-09-16 (×4): qty 0.4

## 2019-09-16 MED ORDER — NEOMYCIN SULFATE 500 MG PO TABS: 1000.0000 mg | ORAL_TABLET | ORAL | Status: DC

## 2019-09-16 MED ORDER — PROPOFOL 10 MG/ML IV BOLUS
INTRAVENOUS | Status: AC
  Filled 2019-09-16: qty 20

## 2019-09-16 MED ORDER — DEXAMETHASONE SODIUM PHOSPHATE 10 MG/ML IJ SOLN
INTRAMUSCULAR | Status: DC | PRN
  Administered 2019-09-16: 6 mg via INTRAVENOUS

## 2019-09-16 MED ORDER — DIPHENHYDRAMINE HCL 12.5 MG/5ML PO ELIX: 12.5000 mg | ORAL_SOLUTION | Freq: Four times a day (QID) | ORAL | Status: DC | PRN

## 2019-09-16 MED ORDER — LACTATED RINGERS IR SOLN
Status: DC | PRN
  Administered 2019-09-16: 1000 mL

## 2019-09-16 MED ORDER — BUPIVACAINE HCL (PF) 0.25 % IJ SOLN
INTRAMUSCULAR | Status: DC | PRN
  Administered 2019-09-16: 30 mL

## 2019-09-16 MED ORDER — ALVIMOPAN 12 MG PO CAPS
ORAL_CAPSULE | ORAL | Status: AC
  Filled 2019-09-16: qty 1

## 2019-09-16 MED ORDER — SODIUM CHLORIDE 0.9 % IV SOLN
2.0000 g | Freq: Two times a day (BID) | INTRAVENOUS | Status: AC
  Administered 2019-09-16: 2 g via INTRAVENOUS
  Filled 2019-09-16: qty 2

## 2019-09-16 MED ORDER — ALVIMOPAN 12 MG PO CAPS
12.0000 mg | ORAL_CAPSULE | Freq: Two times a day (BID) | ORAL | Status: DC
  Administered 2019-09-17: 12 mg via ORAL
  Filled 2019-09-16: qty 1

## 2019-09-16 MED ORDER — ENOXAPARIN SODIUM 40 MG/0.4ML ~~LOC~~ SOLN: 40.0000 mg | Freq: Once | SUBCUTANEOUS | Status: DC

## 2019-09-16 MED ORDER — PROPOFOL 500 MG/50ML IV EMUL
INTRAVENOUS | Status: AC
  Filled 2019-09-16: qty 50

## 2019-09-16 MED ORDER — SUFENTANIL CITRATE 50 MCG/ML IV SOLN
INTRAVENOUS | Status: AC
  Filled 2019-09-16: qty 1

## 2019-09-16 MED ORDER — LIP MEDEX EX OINT
1.0000 "application " | TOPICAL_OINTMENT | Freq: Two times a day (BID) | CUTANEOUS | Status: DC
  Administered 2019-09-16 – 2019-09-20 (×8): 1 via TOPICAL
  Filled 2019-09-16: qty 7

## 2019-09-16 MED ORDER — TRAMADOL HCL 50 MG PO TABS
50.0000 mg | ORAL_TABLET | Freq: Four times a day (QID) | ORAL | Status: DC | PRN
  Administered 2019-09-17 (×2): 50 mg via ORAL
  Filled 2019-09-16: qty 1
  Filled 2019-09-16: qty 2

## 2019-09-16 MED ORDER — ALVIMOPAN 12 MG PO CAPS
12.0000 mg | ORAL_CAPSULE | ORAL | Status: AC
  Administered 2019-09-16: 12 mg via ORAL

## 2019-09-16 MED ORDER — SODIUM CHLORIDE 0.9 % IV SOLN: Freq: Three times a day (TID) | INTRAVENOUS | Status: DC | PRN

## 2019-09-16 MED ORDER — METRONIDAZOLE 500 MG PO TABS: 1000.0000 mg | ORAL_TABLET | ORAL | Status: DC

## 2019-09-16 MED ORDER — METOPROLOL TARTRATE 5 MG/5ML IV SOLN: 5.0000 mg | Freq: Four times a day (QID) | INTRAVENOUS | Status: DC | PRN

## 2019-09-16 MED ORDER — ACETAMINOPHEN 500 MG PO TABS
ORAL_TABLET | ORAL | Status: AC
  Filled 2019-09-16: qty 2

## 2019-09-16 MED ORDER — METOCLOPRAMIDE HCL 5 MG/ML IJ SOLN
5.0000 mg | Freq: Three times a day (TID) | INTRAMUSCULAR | Status: DC | PRN
  Administered 2019-09-16: 10 mg via INTRAVENOUS
  Administered 2019-09-17: 5 mg via INTRAVENOUS
  Filled 2019-09-16 (×3): qty 2

## 2019-09-16 MED ORDER — ESTRADIOL 1 MG PO TABS
0.5000 mg | ORAL_TABLET | Freq: Every day | ORAL | Status: DC
  Administered 2019-09-16 – 2019-09-20 (×5): 0.5 mg via ORAL
  Filled 2019-09-16 (×5): qty 0.5

## 2019-09-16 MED ORDER — DEXAMETHASONE SODIUM PHOSPHATE 10 MG/ML IJ SOLN
INTRAMUSCULAR | Status: AC
  Filled 2019-09-16: qty 1

## 2019-09-16 MED ORDER — GABAPENTIN 300 MG PO CAPS: 300.0000 mg | ORAL_CAPSULE | ORAL | Status: DC

## 2019-09-16 MED ORDER — LIDOCAINE 2% (20 MG/ML) 5 ML SYRINGE
INTRAMUSCULAR | Status: DC | PRN
  Administered 2019-09-16: 1.5 mg/kg/h via INTRAVENOUS

## 2019-09-16 MED ORDER — LORATADINE 10 MG PO TABS
10.0000 mg | ORAL_TABLET | Freq: Every day | ORAL | Status: DC
  Administered 2019-09-17 – 2019-09-20 (×4): 10 mg via ORAL
  Filled 2019-09-16 (×4): qty 1

## 2019-09-16 MED ORDER — ALVIMOPAN 12 MG PO CAPS: 12.0000 mg | ORAL_CAPSULE | ORAL | Status: DC

## 2019-09-16 MED ORDER — ONDANSETRON HCL 4 MG/2ML IJ SOLN
INTRAMUSCULAR | Status: AC
  Filled 2019-09-16: qty 2

## 2019-09-16 MED ORDER — PHENYLEPHRINE HCL (PRESSORS) 10 MG/ML IV SOLN
INTRAVENOUS | Status: AC
  Filled 2019-09-16: qty 1

## 2019-09-16 MED ORDER — SCOPOLAMINE 1 MG/3DAYS TD PT72
1.0000 | MEDICATED_PATCH | Freq: Once | TRANSDERMAL | Status: DC
  Administered 2019-09-16: 1.5 mg via TRANSDERMAL
  Filled 2019-09-16: qty 1

## 2019-09-16 MED ORDER — SODIUM CHLORIDE 0.9 % IV SOLN: 2.0000 g | INTRAVENOUS | Status: DC

## 2019-09-16 MED ORDER — EPHEDRINE SULFATE 50 MG/ML IJ SOLN
INTRAMUSCULAR | Status: DC | PRN
  Administered 2019-09-16: 10 mg via INTRAVENOUS

## 2019-09-16 MED ORDER — SACCHAROMYCES BOULARDII 250 MG PO CAPS: 250.0000 mg | ORAL_CAPSULE | Freq: Two times a day (BID) | ORAL | Status: DC

## 2019-09-16 MED ORDER — ENSURE SURGERY PO LIQD
237.0000 mL | Freq: Two times a day (BID) | ORAL | Status: DC
  Administered 2019-09-17 – 2019-09-19 (×6): 237 mL via ORAL
  Filled 2019-09-16 (×9): qty 237

## 2019-09-16 MED ORDER — BISACODYL 5 MG PO TBEC
20.0000 mg | DELAYED_RELEASE_TABLET | Freq: Once | ORAL | Status: DC
  Filled 2019-09-16: qty 4

## 2019-09-16 MED ORDER — ACETAMINOPHEN 500 MG PO TABS
1000.0000 mg | ORAL_TABLET | ORAL | Status: AC
  Administered 2019-09-16: 1000 mg via ORAL

## 2019-09-16 MED ORDER — POLYETHYLENE GLYCOL 3350 17 GM/SCOOP PO POWD: 1.0000 | Freq: Once | ORAL | Status: DC

## 2019-09-16 MED ORDER — MIDAZOLAM HCL 2 MG/2ML IJ SOLN
INTRAMUSCULAR | Status: AC
  Filled 2019-09-16: qty 2

## 2019-09-16 MED ORDER — HYDROMORPHONE HCL 1 MG/ML IJ SOLN
INTRAMUSCULAR | Status: AC
  Filled 2019-09-16: qty 1

## 2019-09-16 MED ORDER — LACTATED RINGERS IV SOLN: INTRAVENOUS | Status: DC

## 2019-09-16 MED ORDER — BUPIVACAINE LIPOSOME 1.3 % IJ SUSP
20.0000 mL | Freq: Once | INTRAMUSCULAR | Status: DC
  Filled 2019-09-16: qty 20

## 2019-09-16 MED ORDER — BISACODYL 5 MG PO TBEC: 20.0000 mg | DELAYED_RELEASE_TABLET | Freq: Once | ORAL | Status: DC

## 2019-09-16 MED ORDER — HYDROMORPHONE HCL 1 MG/ML IJ SOLN
0.2500 mg | INTRAMUSCULAR | Status: DC | PRN
  Administered 2019-09-16 (×2): 0.5 mg via INTRAVENOUS

## 2019-09-16 MED ORDER — ONDANSETRON HCL 4 MG PO TABS: 4.0000 mg | ORAL_TABLET | Freq: Four times a day (QID) | ORAL | Status: DC | PRN

## 2019-09-16 MED ORDER — SODIUM CHLORIDE 0.9 % IV SOLN
INTRAVENOUS | Status: DC | PRN
  Administered 2019-09-16: 25 ug/min via INTRAVENOUS

## 2019-09-16 MED ORDER — ROCURONIUM BROMIDE 10 MG/ML (PF) SYRINGE
PREFILLED_SYRINGE | INTRAVENOUS | Status: DC | PRN
  Administered 2019-09-16: 30 mg via INTRAVENOUS
  Administered 2019-09-16: 50 mg via INTRAVENOUS
  Administered 2019-09-16: 10 mg via INTRAVENOUS

## 2019-09-16 MED ORDER — FLUTICASONE PROPIONATE 50 MCG/ACT NA SUSP
1.0000 | Freq: Every day | NASAL | Status: DC
  Administered 2019-09-16 – 2019-09-20 (×5): 1 via NASAL
  Filled 2019-09-16: qty 16

## 2019-09-16 MED ORDER — ALUM & MAG HYDROXIDE-SIMETH 200-200-20 MG/5ML PO SUSP: 30.0000 mL | Freq: Four times a day (QID) | ORAL | Status: DC | PRN

## 2019-09-16 MED ORDER — SODIUM CHLORIDE 0.9 % IV SOLN
INTRAVENOUS | Status: DC | PRN
  Administered 2019-09-16: 1000 mL

## 2019-09-16 MED ORDER — LIDOCAINE HCL 2 % IJ SOLN
INTRAMUSCULAR | Status: AC
  Filled 2019-09-16: qty 20

## 2019-09-16 MED ORDER — CELECOXIB 200 MG PO CAPS
200.0000 mg | ORAL_CAPSULE | ORAL | Status: AC
  Administered 2019-09-16: 200 mg via ORAL
  Filled 2019-09-16: qty 1

## 2019-09-16 MED ORDER — MAGIC MOUTHWASH
15.0000 mL | Freq: Four times a day (QID) | ORAL | Status: DC | PRN
  Filled 2019-09-16: qty 15

## 2019-09-16 MED ORDER — HYDROMORPHONE HCL 1 MG/ML IJ SOLN
0.5000 mg | INTRAMUSCULAR | Status: DC | PRN
  Administered 2019-09-17: 0.5 mg via INTRAVENOUS
  Filled 2019-09-16: qty 1

## 2019-09-16 MED ORDER — PROPOFOL 10 MG/ML IV BOLUS
INTRAVENOUS | Status: DC | PRN
  Administered 2019-09-16: 100 mg via INTRAVENOUS

## 2019-09-16 MED ORDER — SODIUM CHLORIDE 0.9 % IV SOLN: INTRAVENOUS | Status: DC

## 2019-09-16 MED ORDER — RISAQUAD PO CAPS
1.0000 | ORAL_CAPSULE | Freq: Two times a day (BID) | ORAL | Status: DC
  Administered 2019-09-17 – 2019-09-20 (×7): 1 via ORAL
  Filled 2019-09-16 (×7): qty 1

## 2019-09-16 MED ORDER — PSYLLIUM 95 % PO PACK
1.0000 | PACK | Freq: Every day | ORAL | Status: DC
  Administered 2019-09-17: 1 via ORAL
  Filled 2019-09-16: qty 1

## 2019-09-16 MED ORDER — BUPIVACAINE HCL (PF) 0.25 % IJ SOLN
INTRAMUSCULAR | Status: AC
  Filled 2019-09-16: qty 30

## 2019-09-16 MED ORDER — ENOXAPARIN SODIUM 40 MG/0.4ML ~~LOC~~ SOLN
40.0000 mg | Freq: Once | SUBCUTANEOUS | Status: AC
  Administered 2019-09-16: 40 mg via SUBCUTANEOUS
  Filled 2019-09-16: qty 0.4

## 2019-09-16 MED ORDER — PROPOFOL 500 MG/50ML IV EMUL
INTRAVENOUS | Status: DC | PRN
  Administered 2019-09-16: 25 ug/kg/min via INTRAVENOUS

## 2019-09-16 MED ORDER — SUFENTANIL CITRATE 50 MCG/ML IV SOLN
INTRAVENOUS | Status: DC | PRN
  Administered 2019-09-16: 5 ug via INTRAVENOUS
  Administered 2019-09-16: 10 ug via INTRAVENOUS
  Administered 2019-09-16 (×2): 5 ug via INTRAVENOUS

## 2019-09-16 MED ORDER — SODIUM CHLORIDE 0.9 % IV SOLN
2.0000 g | INTRAVENOUS | Status: AC
  Administered 2019-09-16: 2 g via INTRAVENOUS
  Filled 2019-09-16: qty 2

## 2019-09-16 MED ORDER — MIDAZOLAM HCL 5 MG/5ML IJ SOLN
INTRAMUSCULAR | Status: DC | PRN
  Administered 2019-09-16 (×2): 1 mg via INTRAVENOUS

## 2019-09-16 MED ORDER — LACTATED RINGERS IV SOLN
INTRAVENOUS | Status: DC
  Administered 2019-09-16 (×3): via INTRAVENOUS

## 2019-09-16 MED ORDER — 0.9 % SODIUM CHLORIDE (POUR BTL) OPTIME
TOPICAL | Status: DC | PRN
  Administered 2019-09-16: 2000 mL

## 2019-09-16 MED ORDER — PHENYLEPHRINE HCL (PRESSORS) 10 MG/ML IV SOLN
INTRAVENOUS | Status: DC | PRN
  Administered 2019-09-16 (×3): 80 ug via INTRAVENOUS

## 2019-09-16 MED ORDER — GABAPENTIN 100 MG PO CAPS
200.0000 mg | ORAL_CAPSULE | Freq: Three times a day (TID) | ORAL | Status: DC
  Administered 2019-09-16 – 2019-09-18 (×5): 200 mg via ORAL
  Filled 2019-09-16 (×6): qty 2

## 2019-09-16 SURGICAL SUPPLY — 97 items
APPLIER CLIP 5 13 M/L LIGAMAX5 (MISCELLANEOUS)
APPLIER CLIP ROT 10 11.4 M/L (STAPLE)
BLADE EXTENDED COATED 6.5IN (ELECTRODE) ×2 IMPLANT
CANNULA REDUC XI 12-8 STAPL (CANNULA) ×1
CANNULA REDUCER 12-8 DVNC XI (CANNULA) ×1 IMPLANT
CELLS DAT CNTRL 66122 CELL SVR (MISCELLANEOUS) IMPLANT
CHLORAPREP W/TINT 26 (MISCELLANEOUS) ×2 IMPLANT
CLIP APPLIE 5 13 M/L LIGAMAX5 (MISCELLANEOUS) IMPLANT
CLIP APPLIE ROT 10 11.4 M/L (STAPLE) IMPLANT
CLIP VESOLOCK LG 6/CT PURPLE (CLIP) IMPLANT
CLIP VESOLOCK MED LG 6/CT (CLIP) IMPLANT
COVER SURGICAL LIGHT HANDLE (MISCELLANEOUS) ×4 IMPLANT
COVER TIP SHEARS 8 DVNC (MISCELLANEOUS) ×1 IMPLANT
COVER TIP SHEARS 8MM DA VINCI (MISCELLANEOUS) ×1
COVER WAND RF STERILE (DRAPES) IMPLANT
DECANTER SPIKE VIAL GLASS SM (MISCELLANEOUS) ×2 IMPLANT
DEVICE TROCAR PUNCTURE CLOSURE (ENDOMECHANICALS) IMPLANT
DRAIN CHANNEL 19F RND (DRAIN) ×2 IMPLANT
DRAPE ARM DVNC X/XI (DISPOSABLE) ×4 IMPLANT
DRAPE COLUMN DVNC XI (DISPOSABLE) ×1 IMPLANT
DRAPE DA VINCI XI ARM (DISPOSABLE) ×4
DRAPE DA VINCI XI COLUMN (DISPOSABLE) ×1
DRAPE SURG IRRIG POUCH 19X23 (DRAPES) ×2 IMPLANT
DRSG OPSITE POSTOP 4X10 (GAUZE/BANDAGES/DRESSINGS) IMPLANT
DRSG OPSITE POSTOP 4X6 (GAUZE/BANDAGES/DRESSINGS) ×2 IMPLANT
DRSG OPSITE POSTOP 4X8 (GAUZE/BANDAGES/DRESSINGS) IMPLANT
DRSG TEGADERM 2-3/8X2-3/4 SM (GAUZE/BANDAGES/DRESSINGS) ×2 IMPLANT
DRSG TEGADERM 4X4.75 (GAUZE/BANDAGES/DRESSINGS) ×2 IMPLANT
ELECT PENCIL ROCKER SW 15FT (MISCELLANEOUS) ×2 IMPLANT
ELECT REM PT RETURN 15FT ADLT (MISCELLANEOUS) ×2 IMPLANT
ENDOLOOP SUT PDS II  0 18 (SUTURE)
ENDOLOOP SUT PDS II 0 18 (SUTURE) IMPLANT
EVACUATOR SILICONE 100CC (DRAIN) ×2 IMPLANT
GAUZE SPONGE 2X2 8PLY STRL LF (GAUZE/BANDAGES/DRESSINGS) ×1 IMPLANT
GAUZE SPONGE 4X4 12PLY STRL (GAUZE/BANDAGES/DRESSINGS) IMPLANT
GLOVE ECLIPSE 8.0 STRL XLNG CF (GLOVE) ×10 IMPLANT
GLOVE INDICATOR 8.0 STRL GRN (GLOVE) ×10 IMPLANT
GOWN STRL REUS W/TWL XL LVL3 (GOWN DISPOSABLE) ×10 IMPLANT
GRASPER SUT TROCAR 14GX15 (MISCELLANEOUS) ×2 IMPLANT
HOLDER FOLEY CATH W/STRAP (MISCELLANEOUS) ×2 IMPLANT
IRRIG SUCT STRYKERFLOW 2 WTIP (MISCELLANEOUS)
IRRIGATION SUCT STRKRFLW 2 WTP (MISCELLANEOUS) IMPLANT
KIT PROCEDURE DA VINCI SI (MISCELLANEOUS)
KIT PROCEDURE DVNC SI (MISCELLANEOUS) IMPLANT
KIT TURNOVER KIT A (KITS) IMPLANT
NEEDLE INSUFFLATION 14GA 120MM (NEEDLE) ×2 IMPLANT
PACK CARDIOVASCULAR III (CUSTOM PROCEDURE TRAY) ×2 IMPLANT
PACK COLON (CUSTOM PROCEDURE TRAY) ×2 IMPLANT
PAD POSITIONING PINK XL (MISCELLANEOUS) ×2 IMPLANT
PORT LAP GEL ALEXIS MED 5-9CM (MISCELLANEOUS) ×2 IMPLANT
PROTECTOR NERVE ULNAR (MISCELLANEOUS) ×4 IMPLANT
RTRCTR WOUND ALEXIS 18CM MED (MISCELLANEOUS)
SCISSORS LAP 5X35 DISP (ENDOMECHANICALS) ×2 IMPLANT
SEAL CANN UNIV 5-8 DVNC XI (MISCELLANEOUS) ×4 IMPLANT
SEAL XI 5MM-8MM UNIVERSAL (MISCELLANEOUS) ×4
SEALER VESSEL DA VINCI XI (MISCELLANEOUS) ×1
SEALER VESSEL EXT DVNC XI (MISCELLANEOUS) ×1 IMPLANT
SLEEVE ADV FIXATION 5X100MM (TROCAR) IMPLANT
SOLUTION ELECTROLUBE (MISCELLANEOUS) ×2 IMPLANT
SPONGE GAUZE 2X2 STER 10/PKG (GAUZE/BANDAGES/DRESSINGS) ×1
STAPLER 45 BLU RELOAD XI (STAPLE) IMPLANT
STAPLER 45 BLUE RELOAD XI (STAPLE)
STAPLER 45 GREEN RELOAD XI (STAPLE)
STAPLER 45 GRN RELOAD XI (STAPLE) IMPLANT
STAPLER CANNULA SEAL DVNC XI (STAPLE) ×1 IMPLANT
STAPLER CANNULA SEAL XI (STAPLE) ×1
STAPLER ECHELON POWER CIR 29 (STAPLE) ×2 IMPLANT
STAPLER SHEATH (SHEATH) ×1
STAPLER SHEATH ENDOWRIST DVNC (SHEATH) ×1 IMPLANT
SURGILUBE 2OZ TUBE FLIPTOP (MISCELLANEOUS) ×2 IMPLANT
SUT MNCRL AB 4-0 PS2 18 (SUTURE) ×2 IMPLANT
SUT PDS AB 1 CT1 27 (SUTURE) ×4 IMPLANT
SUT PDS AB 1 TP1 96 (SUTURE) IMPLANT
SUT PROLENE 0 CT 2 (SUTURE) IMPLANT
SUT PROLENE 2 0 KS (SUTURE) IMPLANT
SUT PROLENE 2 0 SH DA (SUTURE) IMPLANT
SUT SILK 2 0 (SUTURE)
SUT SILK 2 0 SH CR/8 (SUTURE) ×2 IMPLANT
SUT SILK 2-0 18XBRD TIE 12 (SUTURE) IMPLANT
SUT SILK 3 0 (SUTURE) ×1
SUT SILK 3 0 SH CR/8 (SUTURE) ×2 IMPLANT
SUT SILK 3-0 18XBRD TIE 12 (SUTURE) ×1 IMPLANT
SUT V-LOC BARB 180 2/0GR6 GS22 (SUTURE)
SUT VIC AB 3-0 SH 18 (SUTURE) IMPLANT
SUT VIC AB 3-0 SH 27 (SUTURE)
SUT VIC AB 3-0 SH 27XBRD (SUTURE) IMPLANT
SUT VICRYL 0 UR6 27IN ABS (SUTURE) ×2 IMPLANT
SUTURE V-LC BRB 180 2/0GR6GS22 (SUTURE) IMPLANT
SYR 10ML ECCENTRIC (SYRINGE) ×2 IMPLANT
SYS LAPSCP GELPORT 120MM (MISCELLANEOUS)
SYSTEM LAPSCP GELPORT 120MM (MISCELLANEOUS) IMPLANT
TAPE UMBILICAL COTTON 1/8X30 (MISCELLANEOUS) ×2 IMPLANT
TOWEL OR NON WOVEN STRL DISP B (DISPOSABLE) ×2 IMPLANT
TRAY FOLEY MTR SLVR 16FR STAT (SET/KITS/TRAYS/PACK) ×2 IMPLANT
TROCAR ADV FIXATION 5X100MM (TROCAR) ×2 IMPLANT
TUBING CONNECTING 10 (TUBING) ×4 IMPLANT
TUBING INSUFFLATION 10FT LAP (TUBING) ×2 IMPLANT

## 2019-09-16 NOTE — Progress Notes (Signed)
Pt was passing liquid stool with small amount of blood off and on for the first hour or so after arriving on the floor.  Had a lot of nausea and vomited once.  Gave Zofran and Reglan with Reglan appearing to work better for her. Pt was finally able to rest and her pain was at a 3.  As of this time pt has not passed anymore stool. Resting comfortably.

## 2019-09-16 NOTE — Op Note (Signed)
09/16/2019  11:11 AM  PATIENT:  Stephanie Hoover  63 y.o. female  Patient Care Team: System, Pcp Not In as PCP - General Mercer Pod, DO as Consulting Physician (Gynecology) Michael Boston, MD as Consulting Physician (General Surgery) Irene Shipper, MD as Consulting Physician (Gastroenterology)  PRE-OPERATIVE DIAGNOSIS:  DIVERTICULITIS  POST-OPERATIVE DIAGNOSIS:  RECURRENT SIGMOID DIVERTICULITIS  PROCEDURE:   XI ROBOTIC RESECTION OF RECTOSIGMOID COLON RIGID PROCTOSCOPY TAP BLOCK - BILATERAL  SURGEON:  Adin Hector, MD  ASSISTANT: Leighton Ruff, MD, FACS. An experienced assistant was required given the standard of surgical care given the complexity of the case.  This assistant was needed for exposure, dissection, suctioning, retraction, instrument exchange, etc.  ANESTHESIA:   General  Nerve block provided with liposomal bupivacaine (Experel) mixed with 0.25% bupivacaine as a Bilateral TAP block x 80mL each side at the level of the transverse abdominis & preperitoneal spaces along the flank at the anterior axillary line, from subcostal ridge to iliac crest under laparoscopic guidance    EBL:  Total I/O In: 1000 [I.V.:1000] Out: 350 [Urine:250; Blood:100]  Delay start of Pharmacological VTE agent (>24hrs) due to surgical blood loss or risk of bleeding:  no  DRAINS: No  SPECIMEN:   RECTOSIGMOID COLON (open end proximal) DISTAL ANASTOMOTIC RING (final distal margin)  DISPOSITION OF SPECIMEN:  PATHOLOGY  COUNTS:  YES  PLAN OF CARE: Admit to inpatient   PATIENT DISPOSITION:  PACU - hemodynamically stable.  INDICATION:    Patient with recurrent episodes of sigmoid diverticulitis.  Last 1 requiring admission and antibiotics.  Consultation with gastroenterology and surgery.  I recommended segmental resection:  The anatomy & physiology of the digestive tract was discussed.  The pathophysiology of diverticulitis and differential diagnosis was discussed.  Natural history  risks without surgery was discussed.   I worked to give an overview of the disease and the frequent need to have multispecialty involvement.  I feel the risks of no intervention will lead to serious problems that outweigh the operative risks; therefore, I recommended a partial colectomy to remove the pathology.  Laparoscopic & open techniques were discussed.    Risks such as bleeding, infection, abscess, leak, reoperation, possible ostomy, hernia, heart attack, death, and other risks were discussed.  I noted a good likelihood this will help address the problem.   Goals of post-operative recovery were discussed as well.  We will work to minimize complications.  Educational materials on the pathology had been given in the office.  Questions were answered.    The patient expressed understanding & wished to proceed with surgery.  OR FINDINGS:   Patient had inflamed sigmoid colon with adhesions to the left lower quadrant & lateral upper pelvis  No obvious metastatic disease on visceral parietal peritoneum or liver.  The anastomosis rests 11 cm from the anal verge by rigid proctoscopy.  It is an end descending colon to side anteriolateral rectal wall EEA 29 anastomosis  DESCRIPTION:   Informed consent was confirmed.  The patient underwent general anaesthesia without difficulty.  The patient was positioned appropriately.  VTE prevention in place.  The patient was clipped, prepped, & draped in a sterile fashion.  Surgical timeout confirmed our plan.  The patient was positioned in reverse Trendelenburg.  Abdominal entry was gained using Varess technique at the left subcostal ridge on the anterior abdominal wall.  No elevated EtCO2 noted.  Port placed.  Camera inspection revealed no injury.  Extra ports were carefully placed under direct laparoscopic visualization.  I  reflected the greater omentum and the upper abdomen the small bowel in the upper abdomen.  The patient was carefully positioned.  The  Intuitive daVinci robot was docked with camera & instruments carefully placed.  The patient had the thickened sigmoid colon with adhesions to the left lower quadrant.  I was able to elevate the rectosigmoid mesentery.   I scored the base of peritoneum of the medial side of the mesentery of the elevated left colon from the ligament of Treitz to the peritoneal reflection of the mid rectum.   I elevated the sigmoid mesentery and entered into the retro-mesenteric plane. We were able to identify the left ureter and gonadal vessels. We kept those posterior within the retroperitoneum and elevated the left colon mesentery off that. I did isolate the inferior mesenteric artery (IMA) pedicle but did not ligate it yet.  I continued distally and got into the avascular plane posterior to the mesorectum. This allowed me to help mobilize the rectum as well by freeing the mesorectum off the sacrum.  I mobilized the peritoneal coverings towards the peritoneal reflection on both the right and left sides of the rectum.  I stayed away from the right and left ureters.  I kept the lateral vascular pedicles to the rectum intact.   I skeletonized the lymph nodes off the inferior mesenteric artery pedicle.  I went down to its takeoff from the aorta.   I isolated the inferior mesenteric vein off of the ligament of Treitz just cephalad to that as well.  After confirming the left ureter was out of the way, I went ahead and ligated the inferior mesenteric artery pedicle just near its takeoff from the aorta.  I go off any ligation of the inferior mesenteric vein.  We ensured hemostasis. I skeletonized the mesorectum at the junction at the proximal & mid rectum for the distal point of resection.  I mobilized the left colon in a lateral to medial fashion off the line of Toldt up towards the splenic flexure to ensure good mobilization of the remaining left colon to reach into the pelvis.  I skeletonized at the proximal mesorectum and  transected at the proximal rectum using a robotic "green" load 45 mm stapler through a Pfannenstiel 34mm stapler port.  I chose a region at the descending/sigmoid junction that was soft and easily reached down to the rectal stump.  I transected the mesentery of the colon radially to preserve remaining colon blood supply.  I created an extraction incision through a small Pfannenstiel incision in the suprapubic region.  Placed a wound protector.  I was able to eviscerate the rectosigmoid and descending colon out the wound.   I clamped the colon proximal to this area using a reusable pursestringer device.  Passed a 2-0 Keith needle. I transected at the descending/sigmoid junction with a scalpel. I got healthy bleeding mucosa.  We sent the rectosigmoid colon specimen off to go to pathology.  We sized the colon orifice.  I chose a 29 EEA anvil stapler system.  I reinforced the prolene pursestring with interrupted silk suture.  I placed the anvil to the open end of theproximal rectum.  Therefore splenectomy directed remaining colon and closed around it using the pursestring.  We did copious irrigation with crystalloid solution.  Hemostasis was good.  The distal end of the remaining colon easily reached down to the rectal stump, therefore, splenic flexure mobilization was not needed.      Dr Marcello Moores scrubbed down and did gentle anal  dilation and advanced the EEA stapler up the rectal stump.  There was some thickening of the anterior peritoneum, so the spike was brought out at the lateral mid-rectum under direct visualization.  I attached the anvil of the proximal colon the spike of the stapler. Anvil was tightened down and held clamped for 60 seconds. The EEA stapler was fired and held clamped for 30 seconds. The stapler was released & removed. We noted 2 excellent anastomotic rings. Blue stitch is in the proximal ring.  Dr Marcello Moores did rigid proctoscopy noted the anastomosis was at 11 cm from the anal verge consistent  with the proximal rectum.  We did a final irrigation of antibiotic solution (900 mg clindamycin/240 mg gentamicin in a liter of crystalloid) & held that for the pelvic air leak test .  The rectum was insufflated the rectum while clamping the colon proximal to that anastomosis.  There was a negative air leak test. There was no tension of mesentery or bowel at the anastomosis.   Tissues looked viable.  Ureters & bowel uninjured.  The anastomosis looked healthy.  Endoluminal gas was evacuated.  Ports & wound protector removed.  We changed gloves & redraped the patient per colon SSI prevention protocol.  We aspirated the antibiotic irrigation.  Hemostasis was good.  Sterile unused instruments were used from this point.  I closed the skin at the port sites using Monocryl stitch and sterile dressing.  I closed the extraction wound using a 0 Vicryl vertical peritoneal closure and a #1 PDS transverse anterior rectal fascial closure like a small Pfannenstiel closure. I closed the skin with some interrupted Monocryl stitches. I placed antibiotic-soaked wicks into the closure at the corners x2.  I placed sterile dressings.     Patient is being extubated go to recovery room. I had discussed postop care with the patient in detail the office & in the holding area. Instructions are written.  I discussed operative findings, updated the patient's status, discussed probable steps to recovery, and gave postoperative recommendations to the patient's spouse.  Recommendations were made.  Questions were answered.  He expressed understanding & appreciation.   Adin Hector, M.D., F.A.C.S. Gastrointestinal and Minimally Invasive Surgery [facility] [address], [place], [place] [address] [phone] Main / Paging

## 2019-09-16 NOTE — Anesthesia Postprocedure Evaluation (Signed)
Anesthesia Post Note  Patient: Idalia Damewood  Procedure(s) Performed: XI ROBOTIC RESECTION OF RECTOSIGMOID COLON, RIGID PROCTOSCOPY (N/A Abdomen)     Patient location during evaluation: PACU Anesthesia Type: General Level of consciousness: awake and alert Pain management: pain level controlled Vital Signs Assessment: post-procedure vital signs reviewed and stable Respiratory status: spontaneous breathing, nonlabored ventilation and respiratory function stable Cardiovascular status: blood pressure returned to baseline and stable Postop Assessment: no apparent nausea or vomiting Anesthetic complications: no    Last Vitals:  Vitals:   09/16/19 1145 09/16/19 1200  BP: 109/60 108/66  Pulse: 68 70  Resp: (!) 8 11  Temp:    SpO2: 100% 100%    Last Pain:  Vitals:   09/16/19 1200  TempSrc:   PainSc: [place]

## 2019-09-16 NOTE — H&P (Signed)
Stephanie Hoover DOB: [birth year] Married / Language: English / Race: White Female   Patient Care Team: System, Pcp Not In as PCP - General Lugue, Carmelita, DO as Consulting Physician (Gynecology)  ` ` Patient sent for surgical consultation at the request of Dr Scarlette Shorts  Chief Complaint: Recurrent diverticulitis ` ` The patient is a pleasant woman that has had irregular bowels most of her life. Alternating constipation and diarrhea. She had a screening colonoscopy around age 47. She recalls an episode of diverticulitis back then. As in [facility] and used to live in [place]. However, she works up in [place] and spends half the week up here. She's had care with [facility] Dr. Henrene Pastor. She had another attack of diverticulitis in 2015. She had a flare in October 2019. Eventually got better with antibiotics but had diarrhea on the Augmentin. She had another attack in December. Did not get better on oral antibiotics. Felt worse. Was admitted and placed on IV antibiotics and she had a contained abscess. She slowly improved. Her condition remained to consider colonic resection. She had a follow-up colonoscopy done by Dr. Henrene Pastor and February which disproved any intraluminal tumor. Just a few polyps. If the diverticula in sigmoid colon with some thickening but no stricture. She is due to follow up with our group in March to discuss elective colon resection but to the Covid pandemic this was postponed until today.  She comes today by herself. She has not had any definite attacks of diverticulitis since the February. She does get some intermittent crampy abdominal pain. Usually moves her bowels about every 2-3 days. Occasional loose bowel movements as well. Takes probiotics, magnesium, some fiber. Its bed diarrhea and nausea with oral Augmentin, Cipro, metronidazole. She had a hysterectomy at age 63 with an appendectomy. No other abdominal surgery. She does  not smoke. She is not diabetic. She can walk at least an hour without difficulty. She's had some incidental gallstones noted but does not have any history of postprandial nausea vomiting or upper abdominal pain. She recalls her sister had recurrent diverticulitis and required an open colon resection. She would like to avoid an open resection. She's think she's had IBS for most of her life  No personal nor family history of GI/colon cancer, inflammatory bowel disease, allergy such as Celiac Sprue, dietary/dairy problems, colitis, ulcers nor gastritis. No recent sick contacts/gastroenteritis. No travel outside the country. No changes in diet. No dysphagia to solids or liquids. No significant heartburn or reflux. No hematochezia, hematemesis, coffee ground emesis. No evidence of prior gastric/peptic ulceration.  Staying on ABx  (Review of systems as stated in this history (HPI) or in the review of systems. Otherwise all other 12 point ROS are negative) ` ` `   Past Surgical History Andreas Blower, CMA; 06/10/2019 2:06 PM) Appendectomy Breast Biopsy Left. Colon Polyp Removal - Colonoscopy Hysterectomy (not due to cancer) - Complete  Diagnostic Studies History (Armen Ferguson, [place]; 06/10/2019 2:06 PM) Colonoscopy within last year Mammogram 1-3 years ago Pap Smear >5 years ago  Allergies Andreas Blower, CMA; 06/10/2019 2:08 PM) Codeine Phosphate *ANALGESICS - OPIOID* Morphine Sulfate (Concentrate) *ANALGESICS - OPIOID* Promethazine HCl *ANTIHISTAMINES* Meperidine HCl *ANALGESICS - OPIOID* sulfADIAZINE *SULFONAMIDES* Phenergan *ANTIHISTAMINES*  Medication History (Armen Ferguson, CMA; 06/10/2019 2:10 PM) Estradiol (0.5MG  Tablet, Oral) Active. Hyoscyamine Sulfate (0.125MG  Tab Sublingual, Sublingual) Active. Flonase (50MCG/ACT Suspension, Nasal) Active. Bentyl (20MG  Tablet, Oral) Active. Claritin (10MG  Tablet, Oral) Active. Medications Reconciled   Social History Andreas Blower, [place]; 06/10/2019 2:06  PM) Alcohol use Occasional alcohol use. Caffeine use Coffee, Tea. No drug use Tobacco use Never smoker.  Family History Andreas Blower, [place]; 06/10/2019 2:06 PM) Arthritis Father, Mother. Colon Polyps Sister. Thyroid problems Sister.  Pregnancy / Birth History Andreas Blower, [place]; 06/10/2019 2:06 PM) Age of menopause <46 Gravida 2 Maternal age 82-25 Para 2  Other Problems Andreas Blower, [place]; 06/10/2019 2:06 PM) Arthritis Cholelithiasis General anesthesia - complications Hypercholesterolemia Kidney Stone Oophorectomy     Review of Systems (Armen Ferguson CMA; 06/10/2019 2:06 PM) Breast Not Present- Breast Mass, Breast Pain, Nipple Discharge and Skin Changes. Musculoskeletal Not Present- Back Pain, Joint Pain, Joint Stiffness, Muscle Pain, Muscle Weakness and Swelling of Extremities.  Vitals (Armen Ferguson CMA; 06/10/2019 2:07 PM) 06/10/2019 2:07 PM Weight: 131.38 lb Height: 60.5in Body Surface Area: 1.57 m Body Mass Index: 25.23 kg/m  Temp.: 98.66F  Pulse: 95 (Regular)  P.OX: 98% (Room air) BP: 104/70 (Sitting, Left Arm, Standard)   BP 118/66   Pulse 92   Temp 98.3 F (36.8 C) (Oral)   Resp 18   Ht 5\' 1"  (1.549 m)   Wt 59.4 kg   SpO2 99%   BMI 24.75 kg/m       Physical Exam Adin Hector MD; 06/10/2019 3:00 PM)  General Mental Status-Alert. General Appearance-Not in acute distress, Not Sickly. Orientation-Oriented X3. Hydration-Well hydrated. Voice-Normal.  Integumentary Global Assessment Upon inspection and palpation of skin surfaces of the - Axillae: non-tender, no inflammation or ulceration, no drainage. and Distribution of scalp and body hair is normal. General Characteristics Temperature - normal warmth is noted.  Head and Neck Head-normocephalic, atraumatic with no lesions or palpable masses. Face Global Assessment -  atraumatic, no absence of expression. Neck Global Assessment - no abnormal movements, no bruit auscultated on the right, no bruit auscultated on the left, no decreased range of motion, non-tender. Trachea-midline. Thyroid Gland Characteristics - non-tender.  Eye Eyeball - Left-Extraocular movements intact, No Nystagmus - Left. Eyeball - Right-Extraocular movements intact, No Nystagmus - Right. Cornea - Left-No Hazy - Left. Cornea - Right-No Hazy - Right. Sclera/Conjunctiva - Left-No scleral icterus, No Discharge - Left. Sclera/Conjunctiva - Right-No scleral icterus, No Discharge - Right. Pupil - Left-Direct reaction to light normal. Pupil - Right-Direct reaction to light normal.  ENMT Ears Pinna - Left - no drainage observed, no generalized tenderness observed. Pinna - Right - no drainage observed, no generalized tenderness observed. Nose and Sinuses External Inspection of the Nose - no destructive lesion observed. Inspection of the nares - Left - quiet respiration. Inspection of the nares - Right - quiet respiration. Mouth and Throat Lips - Upper Lip - no fissures observed, no pallor noted. Lower Lip - no fissures observed, no pallor noted. Nasopharynx - no discharge present. Oral Cavity/Oropharynx - Tongue - no dryness observed. Oral Mucosa - no cyanosis observed. Hypopharynx - no evidence of airway distress observed.  Chest and Lung Exam Inspection Movements - Normal and Symmetrical. Accessory muscles - No use of accessory muscles in breathing. Palpation Palpation of the chest reveals - Non-tender. Auscultation Breath sounds - Normal and Clear.  Cardiovascular Auscultation Rhythm - Regular. Murmurs & Other Heart Sounds - Auscultation of the heart reveals - No Murmurs and No Systolic Clicks.  Abdomen Inspection Inspection of the abdomen reveals - No Visible peristalsis and No Abnormal pulsations. Umbilicus - No Bleeding, No Urine drainage.  Palpation/Percussion Palpation and Percussion of the abdomen reveal - Soft, Non Tender, No Rebound tenderness, No Rigidity (guarding) and No  Cutaneous hyperesthesia. Note: Abdomen soft. Not severely distended. No diastasis recti. No umbilical or other anterior abdominal wall hernias  Female Genitourinary Sexual Maturity Tanner 5 - Adult hair pattern. Note: No vaginal bleeding nor discharge. Well-healed Pfannenstiel incision. No inguinal hernias. She points to left suprapubic region for a usual area of diverticular pain. She does not have pain today.  Peripheral Vascular Upper Extremity Inspection - Left - No Cyanotic nailbeds - Left, Not Ischemic. Inspection - Right - No Cyanotic nailbeds - Right, Not Ischemic.  Neurologic Neurologic evaluation reveals -normal attention span and ability to concentrate, able to name objects and repeat phrases. Appropriate fund of knowledge , normal sensation and normal coordination. Mental Status Affect - not angry, not paranoid. Cranial Nerves-Normal Bilaterally. Gait-Normal.  Neuropsychiatric Mental status exam performed with findings of-able to articulate well with normal speech/language, rate, volume and coherence, thought content normal with ability to perform basic computations and apply abstract reasoning and no evidence of hallucinations, delusions, obsessions or homicidal/suicidal ideation.  Musculoskeletal Global Assessment Spine, Ribs and Pelvis - no instability, subluxation or laxity. Right Upper Extremity - no instability, subluxation or laxity.  Lymphatic Head & Neck  General Head & Neck Lymphatics: Bilateral - Description - No Localized lymphadenopathy. Axillary  General Axillary Region: Bilateral - Description - No Localized lymphadenopathy. Femoral & Inguinal  Generalized Femoral & Inguinal Lymphatics: Left - Description - No Localized lymphadenopathy. Right - Description - No Localized lymphadenopathy.     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 Last episode with an abscess requiring IV antibiotics. She feels like she's had more than 4 attacks. Colonoscopy in February not showing any major stricture or tumor.  She struggles with diarrhea and poor compliance on oral Augmentin or Cipro and Flagyl. She is tired of the antibiotic regimens.  I think she would benefit from considering segmental colonic resection to break the cycle of recurrent infections. Robotic sigmoid colectomy. Her older sister had to have open surgery for this and she like to consider a minimally invasive approach, liking the idea of robotic resection through her Pfannenstiel incision. She is leaning toward surgery but she wants to discuss with her husband first. She was thinking of waiting until September which is not unreasonable since she hasn't had any flares in the past 2 months. However, the longer she waits, there is an increase of recurrent attacks. Back on Abx as a result for recent flare.  She will discuss with her husband and let us know. I told her I would start the process of scheduling just in case  The anatomy & physiology of the digestive tract was discussed. The pathophysiology of the colon was discussed. Natural history risks without surgery was discussed. I feel the risks of no intervention will lead to serious problems that outweigh the operative risks; therefore, I recommended a partial colectomy to remove the pathology. Minimally invasive (Robotic/Laparoscopic) & open techniques were discussed.  Risks such as bleeding, infection, abscess, leak, reoperation, possible ostomy, hernia, heart attack, death, and other risks were discussed. I noted a good likelihood this will help address the problem. Goals of post-operative recovery were discussed as well.  Need for adequate nutrition, daily bowel regimen and healthy physical activity, to optimize recovery was noted as well. We will work to minimize complications. Educational materials were available as well. Questions were answered. The patient expresses understanding & wishes to proceed  with surgery.    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 I'm skeptical that this will markedly change after doing surgery for diverticulitis. Regardless, I think she would benefit from a fiber bowel regimen.  Current Plans Pt Education - [facility] Good Bowel [facility] (Zakee Deerman)  Adin Hector, MD, FACS, MASCRS Gastrointestinal and Minimally Invasive Surgery    [address] [place], [place] [phone] Main / Paging [phone] Fax

## 2019-09-16 NOTE — Interval H&P Note (Signed)
History and Physical Interval Note:  09/16/2019 8:00 AM  Stephanie Hoover  has presented today for surgery, with the diagnosis of DIVERTICULITIS.  The various methods of treatment have been discussed with the patient and family. After consideration of risks, benefits and other options for treatment, the patient has consented to  Procedure(s): XI ROBOTIC RESECTION OF RECTOSIGMOID COLON, RIGID PROCTOSCOPY (N/A) as a surgical intervention.  The patient's history has been reviewed, patient examined, no change in status, stable for surgery.  I have reviewed the patient's chart and labs.  Questions were answered to the patient's satisfaction.    I have re-reviewed the the patient's records, history, medications, and allergies.  I have re-examined the patient.  I again discussed intraoperative plans and goals of post-operative recovery.  The patient agrees to proceed.  Stephanie Hoover  [birth year] [id number]  Patient Care Team: System, Pcp Not In as PCP - General Lugue, Carmelita, DO as Consulting Physician (Gynecology)  Patient Active Problem List   Diagnosis Date Noted  . Diverticulitis of large intestine with abscess 12/25/2018    Past Medical History:  Diagnosis Date  . Allergy   . Arthritis   . Cholelithiasis   . Complication of anesthesia   . Diverticular disease   . Diverticulitis   . History of kidney stones   . Hyperlipidemia   . IBS (irritable bowel syndrome)   . Kidney stone   . Pilonidal cyst   . PONV (postoperative nausea and vomiting)     Past Surgical History:  Procedure Laterality Date  . ABDOMINAL HYSTERECTOMY     total  . APPENDECTOMY    . FRACTURE SURGERY Right    wrist , clavical    Social History   Socioeconomic History  . Marital status: Married    Spouse name: Not on file  . Number of children: 2  . Years of education: Not on file  . Highest education level: Not on file  Occupational History  . Occupation: Tour manager  Social Needs  . Financial resource  strain: Not on file  . Food insecurity    Worry: Not on file    Inability: Not on file  . Transportation needs    Medical: Not on file    Non-medical: Not on file  Tobacco Use  . Smoking status: Never Smoker  . Smokeless tobacco: Never Used  Substance and Sexual Activity  . Alcohol use: Yes    Comment: Wine- 1 glass nightly  . Drug use: No  . Sexual activity: Not on file  Lifestyle  . Physical activity    Days per week: Not on file    Minutes per session: Not on file  . Stress: Not on file  Relationships  . Social Herbalist on phone: Not on file    Gets together: Not on file    Attends religious service: Not on file    Active member of club or organization: Not on file    Attends meetings of clubs or organizations: Not on file    Relationship status: Not on file  . Intimate partner violence    Fear of current or ex partner: Not on file    Emotionally abused: Not on file    Physically abused: Not on file    Forced sexual activity: Not on file  Other Topics Concern  . Not on file  Social History Narrative  . Not on file    Family History  Problem Relation Age of Onset  .  Gallbladder disease Sister   . Esophageal cancer Neg Hx   . Colon cancer Neg Hx   . Pancreatic cancer Neg Hx   . Liver disease Neg Hx     Medications Prior to Admission  Medication Sig Dispense Refill Last Dose  . amoxicillin-clavulanate (AUGMENTIN) 875-125 MG tablet Take 1 tablet by mouth 2 (two) times daily. 20 tablet 0   . estradiol (ESTRACE) 0.1 MG/GM vaginal cream Place 0.5 Applicatorfuls vaginally 2 (two) times a week.    Past Week at Unknown time  . estradiol (ESTRACE) 0.5 MG tablet Take 0.5 mg by mouth daily.   09/15/2019 at Unknown time  . fluticasone (FLONASE) 50 MCG/ACT nasal spray Place 1 spray into both nostrils daily.   09/15/2019 at Unknown time  . loratadine (CLARITIN) 10 MG tablet Take 10 mg by mouth daily.   09/15/2019 at Unknown time  . Misc Natural Products (HEALTHY  LIVER PO) Take 2 tablets by mouth daily.   Past Week at Unknown time  . Probiotic Product (PROBIOTIC DAILY PO) Take 1 capsule by mouth 2 (two) times daily.   Past Week at Unknown time  . amoxicillin-clavulanate (AUGMENTIN) 875-125 MG tablet Take 1 tablet by mouth 2 (two) times daily. 14 tablet 0   . dicyclomine (BENTYL) 20 MG tablet Take 20 mg by mouth every 6 (six) hours as needed for spasms.   More than a month at Unknown time  . loperamide (IMODIUM) 2 MG capsule Take 2 mg by mouth as needed for diarrhea or loose stools.   More than a month at Unknown time    Current Facility-Administered Medications  Medication Dose Route Frequency Provider Last Rate Last Dose  . acetaminophen (TYLENOL) 500 MG tablet           . alvimopan (ENTEREG) 12 MG capsule           . bupivacaine liposome (EXPAREL) 1.3 % injection 266 mg  20 mL Infiltration Once Michael Boston, MD      . cefoTEtan (CEFOTAN) 2 g in sodium chloride 0.9 % 100 mL IVPB  2 g Intravenous On Call to OR Michael Boston, MD      . clindamycin (CLEOCIN) 900 mg, gentamicin (GARAMYCIN) 240 mg in sodium chloride 0.9 % 1,000 mL for intraperitoneal lavage   Irrigation To OR Michael Boston, MD      . lactated ringers infusion   Intravenous Continuous Brennan Bailey, MD 50 mL/hr at 09/16/19 0700    . scopolamine (TRANSDERM-SCOP) 1 MG/3DAYS 1.5 mg  1 patch Transdermal Once Brennan Bailey, MD   1.5 mg at 09/16/19 [id number]     Allergies  Allergen Reactions  . Codeine Nausea And Vomiting  . Morphine Nausea And Vomiting  . Promethazine     Other reaction(s): Muscle Pain Muscle twitching  . Meperidine Nausea And Vomiting  . Sulfadiazine Rash    BP 118/66   Pulse 92   Temp 98.3 F (36.8 C) (Oral)   Resp 18   Ht 5\' 1"  (1.549 m)   Wt 59.4 kg   SpO2 99%   BMI 24.75 kg/m   Labs: No results found for this or any previous visit (from the past 48 hour(s)).  Imaging / Studies: Ct Abdomen Pelvis W Contrast  Result Date: 09/01/2019 CLINICAL DATA:   63 year old female with history of left lower quadrant abdominal pain. Evaluate for acute diverticulitis. EXAM: CT ABDOMEN AND PELVIS WITH CONTRAST TECHNIQUE: Multidetector CT imaging of the abdomen and pelvis was performed using the  standard protocol following bolus administration of intravenous contrast. CONTRAST:  181mL OMNIPAQUE IOHEXOL 300 MG/ML  SOLN COMPARISON:  CT the abdomen and pelvis 01/21/2019. FINDINGS: Lower chest: Unremarkable. Hepatobiliary: Subcentimeter low-attenuation lesion in segment 4A, too small to characterize, but similar to the prior examination and statistically likely to represent a tiny cyst. No other larger more suspicious appearing hepatic lesions. No intra or extrahepatic biliary ductal dilatation. Small calcified gallstones are noted in the gallbladder lying dependently measuring up to 6 mm. No findings to suggest an acute cholecystitis at this time. Pancreas: No pancreatic mass. No pancreatic ductal dilatation. No pancreatic or peripancreatic fluid collections or inflammatory changes. Spleen: Unremarkable. Adrenals/Urinary Tract: Bilateral kidneys and adrenal glands are normal in appearance. No hydroureteronephrosis. Urinary bladder is normal in appearance. Stomach/Bowel: Normal appearance of the stomach. No pathologic dilatation of small bowel or colon. Numerous colonic diverticulae are noted. In the proximal sigmoid colon there is extensive mural thickening and surrounding inflammatory changes associated with several inflamed diverticuli, best appreciated on axial image 62 of series 2. No discrete diverticular abscess or signs of frank perforation or confidently identified at this time. The appendix is not confidently identified and may be surgically absent. Regardless, there are no inflammatory changes noted adjacent to the cecum to suggest the presence of an acute appendicitis at this time. Vascular/Lymphatic: Aortic atherosclerosis, without evidence of aneurysm or dissection in  the abdominal or pelvic vasculature. No lymphadenopathy noted in the abdomen or pelvis. Reproductive: Status post hysterectomy. Ovaries are not confidently identified may be surgically absent or atrophic. Other: Trace free fluid in the pelvis, likely reactive. No larger volume of ascites. No pneumoperitoneum. Musculoskeletal: There are no aggressive appearing lytic or blastic lesions noted in the visualized portions of the skeleton. IMPRESSION: 1. Findings are compatible with acute diverticulitis in the proximal sigmoid colon. No diverticular abscess or signs of frank perforation are noted at this time. 2. Aortic atherosclerosis. 3. Additional incidental findings, as above. Electronically Signed   By: Vinnie Langton M.D.   On: 09/01/2019 08:23     .Adin Hector, M.D., F.A.C.S. Gastrointestinal and Minimally Invasive Surgery [facility] [address], [place],  [phone] Main / Paging  09/16/2019 8:00 AM    Adin Hector

## 2019-09-16 NOTE — Discharge Instructions (Signed)
SURGERY: POST OP INSTRUCTIONS °(Surgery for small bowel obstruction, colon resection, etc) ° ° °###################################################################### ° °EAT °Gradually transition to a high fiber diet with a fiber supplement over the next few days after discharge ° °WALK °Walk an hour a day.  Control your pain to do that.   ° °CONTROL PAIN °Control pain so that you can walk, sleep, tolerate sneezing/coughing, go up/down stairs. ° °HAVE A BOWEL MOVEMENT DAILY °Keep your bowels regular to avoid problems.  OK to try a laxative to override constipation.  OK to use an antidairrheal to slow down diarrhea.  Call if not better after 2 tries ° °CALL IF YOU HAVE PROBLEMS/CONCERNS °Call if you are still struggling despite following these instructions. °Call if you have concerns not answered by these instructions ° °###################################################################### ° ° °DIET °Follow a light diet the first few days at home.  Start with a bland diet such as soups, liquids, starchy foods, low fat foods, etc.  If you feel full, bloated, or constipated, stay on a ful liquid or pureed/blenderized diet for a few days until you feel better and no longer constipated. °Be sure to drink plenty of fluids every day to avoid getting dehydrated (feeling dizzy, not urinating, etc.). °Gradually add a fiber supplement to your diet over the next week.  Gradually get back to a regular solid diet.  Avoid fast food or heavy meals the first week as you are more likely to get nauseated. °It is expected for your digestive tract to need a few months to get back to normal.  It is common for your bowel movements and stools to be irregular.  You will have occasional bloating and cramping that should eventually fade away.  Until you are eating solid food normally, off all pain medications, and back to regular activities; your bowels will not be normal. °Focus on eating a low-fat, high fiber diet the rest of your life  (See Getting to Good Bowel Health, below). ° °CARE of your INCISION or WOUND °It is good for closed incision and even open wounds to be washed every day.  Shower every day.  Short baths are fine.  Wash the incisions and wounds clean with soap & water.    °If you have a closed incision(s), wash the incision with soap & water every day.  You may leave closed incisions open to air if it is dry.   You may cover the incision with clean gauze & replace it after your daily shower for comfort. °If you have skin tapes (Steristrips) or skin glue (Dermabond) on your incision, leave them in place.  They will fall off on their own like a scab.  You may trim any edges that curl up with clean scissors.  If you have staples, set up an appointment for them to be removed in the office in 10 days after surgery.  °If you have a drain, wash around the skin exit site with soap & water and place a new dressing of gauze or band aid around the skin every day.  Keep the drain site clean & dry.    °If you have an open wound with packing, see wound care instructions.  In general, it is encouraged that you remove your dressing and packing, shower with soap & water, and replace your dressing once a day.  Pack the wound with clean gauze moistened with normal (0.9%) saline to keep the wound moist & uninfected.  Pressure on the dressing for 30 minutes will stop most wound   bleeding.  Eventually your body will heal & pull the open wound closed over the next few months.  °Raw open wounds will occasionally bleed or secrete yellow drainage until it heals closed.  Drain sites will drain a little until the drain is removed.  Even closed incisions can have mild bleeding or drainage the first few days until the skin edges scab over & seal.   °If you have an open wound with a wound vac, see wound vac care instructions. ° ° ° ° °ACTIVITIES as tolerated °Start light daily activities --- self-care, walking, climbing stairs-- beginning the day after surgery.   Gradually increase activities as tolerated.  Control your pain to be active.  Stop when you are tired.  Ideally, walk several times a day, eventually an hour a day.   °Most people are back to most day-to-day activities in a few weeks.  It takes 4-8 weeks to get back to unrestricted, intense activity. °If you can walk 30 minutes without difficulty, it is safe to try more intense activity such as jogging, treadmill, bicycling, low-impact aerobics, swimming, etc. °Save the most intensive and strenuous activity for last (Usually 4-8 weeks after surgery) such as sit-ups, heavy lifting, contact sports, etc.  Refrain from any intense heavy lifting or straining until you are off narcotics for pain control.  You will have off days, but things should improve week-by-week. °DO NOT PUSH THROUGH PAIN.  Let pain be your guide: If it hurts to do something, don't do it.  Pain is your body warning you to avoid that activity for another week until the pain goes down. °You may drive when you are no longer taking narcotic prescription pain medication, you can comfortably wear a seatbelt, and you can safely make sudden turns/stops to protect yourself without hesitating due to pain. °You may have sexual intercourse when it is comfortable. If it hurts to do something, stop. ° °MEDICATIONS °Take your usually prescribed home medications unless otherwise directed.   °Blood thinners:  °Usually you can restart any strong blood thinners after the second postoperative day.  It is OK to take aspirin right away.    ° If you are on strong blood thinners (warfarin/Coumadin, Plavix, Xerelto, Eliquis, Pradaxa, etc), discuss with your surgeon, medicine PCP, and/or cardiologist for instructions on when to restart the blood thinner & if blood monitoring is needed (PT/INR blood check, etc).   ° ° °PAIN CONTROL °Pain after surgery or related to activity is often due to strain/injury to muscle, tendon, nerves and/or incisions.  This pain is usually  short-term and will improve in a few months.  °To help speed the process of healing and to get back to regular activity more quickly, DO THE FOLLOWING THINGS TOGETHER: °1. Increase activity gradually.  DO NOT PUSH THROUGH PAIN °2. Use Ice and/or Heat °3. Try Gentle Massage and/or Stretching °4. Take over the counter pain medication °5. Take Narcotic prescription pain medication for more severe pain ° °Good pain control = faster recovery.  It is better to take more medicine to be more active than to stay in bed all day to avoid medications. °1.  Increase activity gradually °Avoid heavy lifting at first, then increase to lifting as tolerated over the next 6 weeks. °Do not “push through” the pain.  Listen to your body and avoid positions and maneuvers than reproduce the pain.  Wait a few days before trying something more intense °Walking an hour a day is encouraged to help your body recover faster   and more safely.  Start slowly and stop when getting sore.  If you can walk 30 minutes without stopping or pain, you can try more intense activity (running, jogging, aerobics, cycling, swimming, treadmill, sex, sports, weightlifting, etc.) °Remember: If it hurts to do it, then don’t do it! °2. Use Ice and/or Heat °You will have swelling and bruising around the incisions.  This will take several weeks to resolve. °Ice packs or heating pads (6-8 times a day, 30-60 minutes at a time) will help sooth soreness & bruising. °Some people prefer to use ice alone, heat alone, or alternate between ice & heat.  Experiment and see what works best for you.  Consider trying ice for the first few days to help decrease swelling and bruising; then, switch to heat to help relax sore spots and speed recovery. °Shower every day.  Short baths are fine.  It feels good!  Keep the incisions and wounds clean with soap & water.   °3. Try Gentle Massage and/or Stretching °Massage at the area of pain many times a day °Stop if you feel pain - do not  overdo it °4. Take over the counter pain medication °This helps the muscle and nerve tissues become less irritable and calm down faster °Choose ONE of the following over-the-counter anti-inflammatory medications: °Acetaminophen 500mg tabs (Tylenol) 1-2 pills with every meal and just before bedtime (avoid if you have liver problems or if you have acetaminophen in you narcotic prescription) °Naproxen 220mg tabs (ex. Aleve, Naprosyn) 1-2 pills twice a day (avoid if you have kidney, stomach, IBD, or bleeding problems) °Ibuprofen 200mg tabs (ex. Advil, Motrin) 3-4 pills with every meal and just before bedtime (avoid if you have kidney, stomach, IBD, or bleeding problems) °Take with food/snack several times a day as directed for at least 2 weeks to help keep pain / soreness down & more manageable. °5. Take Narcotic prescription pain medication for more severe pain °A prescription for strong pain control is often given to you upon discharge (for example: oxycodone/Percocet, hydrocodone/Norco/Vicodin, or tramadol/Ultram) °Take your pain medication as prescribed. °Be mindful that most narcotic prescriptions contain Tylenol (acetaminophen) as well - avoid taking too much Tylenol. °If you are having problems/concerns with the prescription medicine (does not control pain, nausea, vomiting, rash, itching, etc.), please call us [phone] to see if we need to switch you to a different pain medicine that will work better for you and/or control your side effects better. °If you need a refill on your pain medication, you must call the office before 4 pm and on weekdays only.  By federal law, prescriptions for narcotics cannot be called into a pharmacy.  They must be filled out on paper & picked up from our office by the patient or authorized caretaker.  Prescriptions cannot be filled after 4 pm nor on weekends.   ° °WHEN TO CALL US [phone] °Severe uncontrolled or worsening pain  °Fever over 101 F (38.5 C) °Concerns with  the incision: Worsening pain, redness, rash/hives, swelling, bleeding, or drainage °Reactions / problems with new medications (itching, rash, hives, nausea, etc.) °Nausea and/or vomiting °Difficulty urinating °Difficulty breathing °Worsening fatigue, dizziness, lightheadedness, blurred vision °Other concerns °If you are not getting better after two weeks or are noticing you are getting worse, contact our office [phone] for further advice.  We may need to adjust your medications, re-evaluate you in the office, send you to the emergency room, or see what other things we can do to help. °The   clinic staff is available to answer your questions during regular business hours (8:30am-5pm).  Please don’t hesitate to call and ask to speak to one of our nurses for clinical concerns.    °A surgeon from [facility] is always on call at the hospitals 24 hours/day °If you have a medical emergency, go to the nearest emergency room or call 911. ° °FOLLOW UP in our office °One the day of your discharge from the hospital (or the next business weekday), please call [facility] to set up or confirm an appointment to see your surgeon in the office for a follow-up appointment.  Usually it is 2-3 weeks after your surgery.   °If you have skin staples at your incision(s), let the office know so we can set up a time in the office for the nurse to remove them (usually around 10 days after surgery). °Make sure that you call for appointments the day of discharge (or the next business weekday) from the hospital to ensure a convenient appointment time. °IF YOU HAVE DISABILITY OR FAMILY LEAVE FORMS, BRING THEM TO THE OFFICE FOR PROCESSING.  DO NOT GIVE THEM TO YOUR DOCTOR. ° °[facility] °[address], [address], [place],   [address] ? °[phone] - Main °[phone] - Toll Free,  [phone] - Fax °[email or web address] ° °GETTING TO GOOD BOWEL HEALTH. °It is  expected for your digestive tract to need a few months to get back to normal.  It is common for your bowel movements and stools to be irregular.  You will have occasional bloating and cramping that should eventually fade away.  Until you are eating solid food normally, off all pain medications, and back to regular activities; your bowels will not be normal.   °Avoiding constipation °The goal: ONE SOFT BOWEL MOVEMENT A DAY!    °Drink plenty of fluids.  Choose water first. °TAKE A FIBER SUPPLEMENT EVERY DAY THE REST OF YOUR LIFE °During your first week back home, gradually add back a fiber supplement every day °Experiment which form you can tolerate.   There are many forms such as powders, tablets, wafers, gummies, etc °Psyllium bran (Metamucil), methylcellulose (Citrucel), Miralax or Glycolax, Benefiber, Flax Seed.  °Adjust the dose week-by-week (1/2 dose/day to 6 doses a day) until you are moving your bowels 1-2 times a day.  Cut back the dose or try a different fiber product if it is giving you problems such as diarrhea or bloating. °Sometimes a laxative is needed to help jump-start bowels if constipated until the fiber supplement can help regulate your bowels.  If you are tolerating eating & you are farting, it is okay to try a gentle laxative such as double dose MiraLax, prune juice, or Milk of Magnesia.  Avoid using laxatives too often. °Stool softeners can sometimes help counteract the constipating effects of narcotic pain medicines.  It can also cause diarrhea, so avoid using for too long. °If you are still constipated despite taking fiber daily, eating solids, and a few doses of laxatives, call our office. °Controlling diarrhea °Try drinking liquids and eating bland foods for a few days to avoid stressing your intestines further. °Avoid dairy products (especially milk & ice cream) for a short time.  The intestines often can lose the ability to digest lactose when stressed. °Avoid foods that cause gassiness or  bloating.  Typical foods include beans and other legumes, cabbage, broccoli, and dairy foods.  Avoid greasy, spicy, fast foods.  Every person has   some sensitivity to other foods, so listen to your body and avoid those foods that trigger problems for you. °Probiotics (such as active yogurt, Align, etc) may help repopulate the intestines and colon with normal bacteria and calm down a sensitive digestive tract °Adding a fiber supplement gradually can help thicken stools by absorbing excess fluid and retrain the intestines to act more normally.  Slowly increase the dose over a few weeks.  Too much fiber too soon can backfire and cause cramping & bloating. °It is okay to try and slow down diarrhea with a few doses of antidiarrheal medicines.   °Bismuth subsalicylate (ex. Kayopectate, Pepto Bismol) for a few doses can help control diarrhea.  Avoid if pregnant.   °Loperamide (Imodium) can slow down diarrhea.  Start with one tablet (2mg) first.  Avoid if you are having fevers or severe pain.  °ILEOSTOMY PATIENTS WILL HAVE CHRONIC DIARRHEA since their colon is not in use.    °Drink plenty of liquids.  You will need to drink even more glasses of water/liquid a day to avoid getting dehydrated. °Record output from your ileostomy.  Expect to empty the bag every 3-4 hours at first.  Most people with a permanent ileostomy empty their bag 4-6 times at the least.   °Use antidiarrheal medicine (especially Imodium) several times a day to avoid getting dehydrated.  Start with a dose at bedtime & breakfast.  Adjust up or down as needed.  Increase antidiarrheal medications as directed to avoid emptying the bag more than 8 times a day (every 3 hours). °Work with your wound ostomy nurse to learn care for your ostomy.  See ostomy care instructions. °TROUBLESHOOTING IRREGULAR BOWELS °1) Start with a soft & bland diet. No spicy, greasy, or fried foods.  °2) Avoid gluten/wheat or dairy products from diet to see if symptoms improve. °3) Miralax  17gm or flax seed mixed in 8oz. water or juice-daily. May use 2-4 times a day as needed. °4) Gas-X, Phazyme, etc. as needed for gas & bloating.  °5) Prilosec (omeprazole) over-the-counter as needed °6)  Consider probiotics (Align, Activa, etc) to help calm the bowels down ° °Call your doctor if you are getting worse or not getting better.  Sometimes further testing (cultures, endoscopy, X-ray studies, CT scans, bloodwork, etc.) may be needed to help diagnose and treat the cause of the diarrhea. °[facility] °[address], [address], [place], [place]  [address] °[phone] - Main.    °[phone]  - Toll Free.   [phone] - Fax °[email or web address] ° ° ° °Diverticulosis ° °Diverticulosis is a condition that develops when small pouches (diverticula) form in the wall of the large intestine (colon). The colon is where water is absorbed and stool is formed. The pouches form when the inside layer of the colon pushes through weak spots in the outer layers of the colon. You may have a few pouches or many of them. °What are the causes? °The cause of this condition is not known. °What increases the risk? °The following factors may make you more likely to develop this condition: °· Being older than age 60. Your risk for this condition increases with age. Diverticulosis is rare among people younger than age 30. By age 80, many people have it. °· Eating a low-fiber diet. °· Having frequent constipation. °· Being overweight. °· Not getting enough exercise. °· Smoking. °· Taking over-the-counter pain medicines, like aspirin and ibuprofen. °· Having a family history of diverticulosis. °What are the signs   or symptoms? °In most people, there are no symptoms of this condition. If you do have symptoms, they may include: °· Bloating. °· Cramps in the abdomen. °· Constipation or diarrhea. °· Pain in the lower left side of the abdomen. °How is this diagnosed? °This condition is most often  diagnosed during an exam for other colon problems. Because diverticulosis usually has no symptoms, it often cannot be diagnosed independently. This condition may be diagnosed by: °· Using a flexible scope to examine the colon (colonoscopy). °· Taking an X-ray of the colon after dye has been put into the colon (barium enema). °· Doing a CT scan. °How is this treated? °You may not need treatment for this condition if you have never developed an infection related to diverticulosis. If you have had an infection before, treatment may include: °· Eating a high-fiber diet. This may include eating more fruits, vegetables, and grains. °· Taking a fiber supplement. °· Taking a live bacteria supplement (probiotic). °· Taking medicine to relax your colon. °· Taking antibiotic medicines. °Follow these instructions at home: °· Drink 6-8 glasses of water or more each day to prevent constipation. °· Try not to strain when you have a bowel movement. °· If you have had an infection before: °? Eat more fiber as directed by your health care provider or your diet and nutrition specialist (dietitian). °? Take a fiber supplement or probiotic, if your health care provider approves. °· Take over-the-counter and prescription medicines only as told by your health care provider. °· If you were prescribed an antibiotic, take it as told by your health care provider. Do not stop taking the antibiotic even if you start to feel better. °· Keep all follow-up visits as told by your health care provider. This is important. °Contact a health care provider if: °· You have pain in your abdomen. °· You have bloating. °· You have cramps. °· You have not had a bowel movement in 3 days. °Get help right away if: °· Your pain gets worse. °· Your bloating becomes very bad. °· You have a fever or chills, and your symptoms suddenly get worse. °· You vomit. °· You have bowel movements that are bloody or black. °· You have bleeding from your  rectum. °Summary °· Diverticulosis is a condition that develops when small pouches (diverticula) form in the wall of the large intestine (colon). °· You may have a few pouches or many of them. °· This condition is most often diagnosed during an exam for other colon problems. °· If you have had an infection related to diverticulosis, treatment may include increasing the fiber in your diet, taking supplements, or taking medicines. °This information is not intended to replace advice given to you by your health care provider. Make sure you discuss any questions you have with your health care provider. °Document Released: 08/30/2004 Document Revised: 11/15/2017 Document Reviewed: 10/22/2016 °Elsevier Patient Education © 2020 [employer] ° °

## 2019-09-16 NOTE — Anesthesia Procedure Notes (Signed)
Procedure Name: Intubation Date/Time: 09/16/2019 8:41 AM Performed by: Lissa Morales, CRNA Pre-anesthesia Checklist: Patient identified, Emergency Drugs available, Suction available, Patient being monitored and Timeout performed Patient Re-evaluated:Patient Re-evaluated prior to induction Oxygen Delivery Method: Circle system utilized Preoxygenation: Pre-oxygenation with 100% oxygen Induction Type: IV induction and Cricoid Pressure applied Ventilation: Mask ventilation without difficulty Grade View: Grade II Tube type: Oral Tube size: 7.0 mm Number of attempts: 1 Airway Equipment and Method: Stylet and Oral airway Placement Confirmation: ETT inserted through vocal cords under direct vision,  positive ETCO2 and breath sounds checked- equal and bilateral Secured at: 21 cm Tube secured with: Tape Dental Injury: Teeth and Oropharynx as per pre-operative assessment

## 2019-09-16 NOTE — Transfer of Care (Signed)
Immediate Anesthesia Transfer of Care Note  Patient: Stephanie Hoover  Procedure(s) Performed: XI ROBOTIC RESECTION OF RECTOSIGMOID COLON, RIGID PROCTOSCOPY (N/A Abdomen)  Patient Location: PACU  Anesthesia Type:General  Level of Consciousness: awake, alert , oriented and patient cooperative  Airway & Oxygen Therapy: Patient Spontanous Breathing and Patient connected to face mask oxygen  Post-op Assessment: Report given to RN, Post -op Vital signs reviewed and stable and Patient moving all extremities X 4  Post vital signs: stable  Last Vitals:  Vitals Value Taken Time  BP 103/59 09/16/19 1125  Temp    Pulse 70 09/16/19 1130  Resp    SpO2 100 % 09/16/19 1130  Vitals shown include unvalidated device data.  Last Pain:  Vitals:   09/16/19 0651  TempSrc:   PainSc: 0-No pain         Complications: No apparent anesthesia complications

## 2019-09-17 ENCOUNTER — Encounter (HOSPITAL_COMMUNITY): Payer: Self-pay | Admitting: Surgery

## 2019-09-17 DIAGNOSIS — R112 Nausea with vomiting, unspecified: Secondary | ICD-10-CM

## 2019-09-17 DIAGNOSIS — T8859XA Other complications of anesthesia, initial encounter: Secondary | ICD-10-CM | POA: Diagnosis present

## 2019-09-17 DIAGNOSIS — T4145XA Adverse effect of unspecified anesthetic, initial encounter: Secondary | ICD-10-CM | POA: Diagnosis present

## 2019-09-17 DIAGNOSIS — K589 Irritable bowel syndrome without diarrhea: Secondary | ICD-10-CM | POA: Diagnosis present

## 2019-09-17 DIAGNOSIS — K5790 Diverticulosis of intestine, part unspecified, without perforation or abscess without bleeding: Secondary | ICD-10-CM | POA: Diagnosis present

## 2019-09-17 DIAGNOSIS — Z9889 Other specified postprocedural states: Secondary | ICD-10-CM

## 2019-09-17 LAB — BASIC METABOLIC PANEL
Anion gap: 6 (ref 5–15)
BUN: <5 mg/dL — ABNORMAL LOW (ref 8–23)
CO2: 22 mmol/L (ref 22–32)
Calcium: 7.7 mg/dL — ABNORMAL LOW (ref 8.9–10.3)
Chloride: 106 mmol/L (ref 98–111)
Creatinine, Ser: 0.76 mg/dL (ref 0.44–1.00)
GFR calc Af Amer: >60 mL/min (ref >60)
GFR calc non Af Amer: >60 mL/min (ref >60)
Glucose, Bld: 99 mg/dL (ref 70–99)
Potassium: 3.9 mmol/L (ref 3.5–5.1)
Sodium: 134 mmol/L — ABNORMAL LOW (ref 135–145)

## 2019-09-17 LAB — CBC
HCT: 33.5 % — ABNORMAL LOW (ref 36.0–46.0)
Hemoglobin: 11 g/dL — ABNORMAL LOW (ref 12.0–15.0)
MCH: 32 pg (ref 26.0–34.0)
MCHC: 32.8 g/dL (ref 30.0–36.0)
MCV: 97.4 fL (ref 80.0–100.0)
Platelets: 252 10*3/uL (ref 150–400)
RBC: 3.44 MIL/uL — ABNORMAL LOW (ref 3.87–5.11)
RDW: 12.3 % (ref 11.5–15.5)
WBC: 12.9 10*3/uL — ABNORMAL HIGH (ref 4.0–10.5)
nRBC: 0 % (ref 0.0–0.2)

## 2019-09-17 LAB — MAGNESIUM: Magnesium: 1.6 mg/dL — ABNORMAL LOW (ref 1.7–2.4)

## 2019-09-17 MED ORDER — LACTATED RINGERS IV BOLUS
1000.0000 mL | Freq: Three times a day (TID) | INTRAVENOUS | Status: AC | PRN
  Administered 2019-09-17: 11:00:00 1000 mL via INTRAVENOUS

## 2019-09-17 MED ORDER — LORAZEPAM 2 MG/ML IJ SOLN: 0.5000 mg | Freq: Three times a day (TID) | INTRAMUSCULAR | Status: DC | PRN

## 2019-09-17 MED ORDER — BISMUTH SUBSALICYLATE 262 MG/15ML PO SUSP
30.0000 mL | Freq: Three times a day (TID) | ORAL | Status: DC | PRN
  Filled 2019-09-17: qty 236

## 2019-09-17 MED ORDER — MAGNESIUM SULFATE 2 GM/50ML IV SOLN
2.0000 g | Freq: Once | INTRAVENOUS | Status: AC
  Administered 2019-09-17: 2 g via INTRAVENOUS
  Filled 2019-09-17: qty 50

## 2019-09-17 NOTE — Progress Notes (Signed)
Bolus of LR given for SBP less than 90. Pt asymptomatic. No distress or dizziness. Sitting chair at present time.

## 2019-09-17 NOTE — Progress Notes (Signed)
Stephanie Hoover [id number] 11-19-1956  CARE TEAM:  PCP: System, Pcp Not In  Outpatient Care Team: Patient Care Team: System, Pcp Not In as PCP - General Mercer Pod, DO as Consulting Physician (Gynecology) Michael Boston, MD as Consulting Physician (General Surgery) Irene Shipper, MD as Consulting Physician (Gastroenterology)  Inpatient Treatment Team: Treatment Team: Attending Provider: Michael Boston, MD; Technician: Leda Quail, NT; Technician: Sue Lush, NT; Utilization Review: Sindy Guadeloupe, RN   Problem List:   Principal Problem:   Diverticulitis s/p robotic rectosigmoid resection 09/16/2019 Active Problems:   Diverticulitis large intestine   PONV (postoperative nausea and vomiting)   IBS (irritable bowel syndrome)   Diverticular disease   Complication of anesthesia   Hypomagnesemia   1 Day Post-Op  09/16/2019  POST-OPERATIVE DIAGNOSIS:  RECURRENT SIGMOID DIVERTICULITIS  PROCEDURE:   XI ROBOTIC RESECTION OF RECTOSIGMOID COLON RIGID PROCTOSCOPY TAP BLOCK - BILATERAL  SURGEON:  Adin Hector, MD    Assessment  OK  [facility] Stay = 1 days)  Plan:  -PONV - expected despite aggressive regimen - better.  Continue PRNs & ERAs to minimize -low Mag - replace -f/u pathology -VTE prophylaxis- SCDs, etc -mobilize as tolerated to help recovery  20 minutes spent in review, evaluation, examination, counseling, and coordination of care.  More than 50% of that time was spent in counseling.  09/17/2019    Subjective: (Chief complaint)  Nausea severe last night - better now Loose BMs Tol clears this AM Walked into hallway x 1  Objective:  Vital signs:  Vitals:   09/16/19 2101 09/17/19 0605 09/17/19 0610 09/17/19 0720  BP: 95/73 (!) 88/57 (!) 101/49 (!) 93/55  Pulse: 75 77 73 73  Resp:  16  16  Temp:  98 F (36.7 C)  98.5 F (36.9 C)  TempSrc:  Oral  Oral  SpO2: 100% 98%  100%  Weight:   62.9 kg   Height:         Last BM Date: 09/16/19  Intake/Output   Yesterday:  09/30 0701 - 10/01 0700 In: 2400 [I.V.:2400] Out: 1650 [Urine:1550; Blood:100] This shift:  No intake/output data recorded.  Bowel function:  Flatus: YES  BM:  No  Drain: (No drain)   Physical Exam:  General: Pt awake/alert/oriented x4 in mild acute distress Eyes: PERRL, normal EOM.  Sclera clear.  No icterus Neuro: CN II-XII intact w/o focal sensory/motor deficits. Lymph: No head/neck/groin lymphadenopathy Psych:  No delerium/psychosis/paranoia HENT: Normocephalic, Mucus membranes moist.  No thrush Neck: Supple, No tracheal deviation Chest:  No chest wall pain w good excursion CV:  Pulses intact.  Regular rhythm MS: Normal AROM mjr joints.  No obvious deformity  Abdomen: Soft.  Nondistended.  Mildly tender at incisions only.  No evidence of peritonitis.  No incarcerated hernias.  Ext:   No deformity.  No mjr edema.  No cyanosis Skin: No petechiae / purpura  Results:   Cultures: Recent Results (from the past 720 hour(s))  Novel Coronavirus, NAA (Hosp order, Send-out to Ref Lab; TAT 18-24 hrs     Status: None   Collection Time: 09/12/19  5:34 PM   Specimen: Nasopharyngeal Swab; Respiratory  Result Value Ref Range Status   SARS-CoV-2, NAA NOT DETECTED NOT DETECTED Final    Comment: (NOTE) This nucleic acid amplification test was developed and its performance characteristics determined by [employer]. Nucleic acid amplification tests include PCR and TMA. This test has not been FDA cleared or approved. This test has been authorized by  FDA under an Emergency Use Authorization (EUA). This test is only authorized for the duration of time the declaration that circumstances exist justifying the authorization of the emergency use of in vitro diagnostic tests for detection of SARS-CoV-2 virus and/or diagnosis of COVID-19 infection under section 564(b)(1) of the Act, 21 U.S.C. [id number]) (1), unless the  authorization is terminated or revoked sooner. When diagnostic testing is negative, the possibility of a false negative result should be considered in the context of a patient's recent exposures and the presence of clinical signs and symptoms consistent with COVID-19. An individual without symptoms of COVID- 19 and who is not shedding SARS-CoV-2 vi rus would expect to have a negative (not detected) result in this assay. Performed At: [facility] [address] [place], [place] [id number] Rush Farmer MD [id number]    Viola  Final    Comment: Performed at [facility], [place] [address] [place], [place] [address]    Labs: Results for orders placed or performed during the hospital encounter of 09/16/19 (from the past 48 hour(s))  Basic metabolic panel     Status: Abnormal   Collection Time: 09/17/19  3:46 AM  Result Value Ref Range   Sodium 134 (L) 135 - 145 mmol/L   Potassium 3.9 3.5 - 5.1 mmol/L   Chloride 106 98 - 111 mmol/L   CO2 22 22 - 32 mmol/L   Glucose, Bld 99 70 - 99 mg/dL   BUN <5 (L) 8 - 23 mg/dL   Creatinine, Ser 0.76 0.44 - 1.00 mg/dL   Calcium 7.7 (L) 8.9 - 10.3 mg/dL   GFR calc non Af Amer >60 >60 mL/min   GFR calc Af Amer >60 >60 mL/min   Anion gap 6 5 - 15    Comment: Performed at [facility], [place] [address] [place], [place] [address]  CBC     Status: Abnormal   Collection Time: 09/17/19  3:46 AM  Result Value Ref Range   WBC 12.9 (H) 4.0 - 10.5 K/uL   RBC 3.44 (L) 3.87 - 5.11 MIL/uL   Hemoglobin 11.0 (L) 12.0 - 15.0 g/dL   HCT 33.5 (L) 36.0 - 46.0 %   MCV 97.4 80.0 - 100.0 fL   MCH 32.0 26.0 - 34.0 pg   MCHC 32.8 30.0 - 36.0 g/dL   RDW 12.3 11.5 - 15.5 %   Platelets 252 150 - 400 K/uL   nRBC 0.0 0.0 - 0.2 %    Comment: Performed at [facility], [place] [address], [place], [place] [address]  Magnesium     Status: Abnormal   Collection Time: 09/17/19  3:46 AM  Result  Value Ref Range   Magnesium 1.6 (L) 1.7 - 2.4 mg/dL    Comment: Performed at [facility], [place] [address], [place], [place] [address]    Imaging / Studies: No results found.  Medications / Allergies: per chart  Antibiotics: Anti-infectives (From admission, onward)   Start     Dose/Rate Route Frequency Ordered Stop   09/16/19 2100  cefoTEtan (CEFOTAN) 2 g in sodium chloride 0.9 % 100 mL IVPB     2 g 200 mL/hr over 30 Minutes Intravenous Every 12 hours 09/16/19 1323 09/16/19 2142   09/16/19 1400  neomycin (MYCIFRADIN) tablet 1,000 mg  Status:  Discontinued     1,000 mg Oral 3 times per day 09/16/19 0631 09/16/19 0633   09/16/19 1400  metroNIDAZOLE (FLAGYL) tablet 1,000 mg  Status:  Discontinued     1,000 mg Oral 3 times per day 09/16/19 0631 09/16/19 0633   09/16/19 1400  neomycin (MYCIFRADIN) tablet 1,000 mg  Status:  Discontinued     1,000 mg Oral 3 times per day 09/16/19 0631 09/16/19 0633   09/16/19 1400  metroNIDAZOLE (FLAGYL) tablet 1,000 mg  Status:  Discontinued     1,000 mg Oral 3 times per day 09/16/19 0631 09/16/19 0633   09/16/19 1037  clindamycin (CLEOCIN) 900 mg, gentamicin (GARAMYCIN) 240 mg in sodium chloride 0.9 % 1,000 mL for intraperitoneal lavage  Status:  Discontinued       As needed 09/16/19 1037 09/16/19 1122   09/16/19 0645  cefoTEtan (CEFOTAN) 2 g in sodium chloride 0.9 % 100 mL IVPB     2 g 200 mL/hr over 30 Minutes Intravenous On call to O.R. 09/16/19 0631 09/16/19 [id number]   09/16/19 0645  cefoTEtan (CEFOTAN) 2 g in sodium chloride 0.9 % 100 mL IVPB  Status:  Discontinued     2 g 200 mL/hr over 30 Minutes Intravenous On call to O.R. 09/16/19 0631 09/16/19 [id number]   09/16/19 0645  clindamycin (CLEOCIN) 900 mg, gentamicin (GARAMYCIN) 240 mg in sodium chloride 0.9 % 1,000 mL for intraperitoneal lavage  Status:  Discontinued      Irrigation To Surgery 09/16/19 0631 09/16/19 0633   09/16/19 0600  clindamycin (CLEOCIN) 900 mg, gentamicin (GARAMYCIN)  240 mg in sodium chloride 0.9 % 1,000 mL for intraperitoneal lavage  Status:  Discontinued      Irrigation To Surgery 09/15/19 0917 09/16/19 1301        Note: Portions of this report may have been transcribed using voice recognition software. Every effort was made to ensure accuracy; however, inadvertent computerized transcription errors may be present.   Any transcriptional errors that result from this process are unintentional.     Adin Hector, MD, FACS, MASCRS Gastrointestinal and Minimally Invasive Surgery    [address], [place], [place] [address] [phone] Main / Paging [phone] Fax

## 2019-09-18 DIAGNOSIS — E876 Hypokalemia: Secondary | ICD-10-CM

## 2019-09-18 LAB — CBC
HCT: 32.3 % — ABNORMAL LOW (ref 36.0–46.0)
Hemoglobin: 10.7 g/dL — ABNORMAL LOW (ref 12.0–15.0)
MCH: 32.6 pg (ref 26.0–34.0)
MCHC: 33.1 g/dL (ref 30.0–36.0)
MCV: 98.5 fL (ref 80.0–100.0)
Platelets: 221 10*3/uL (ref 150–400)
RBC: 3.28 MIL/uL — ABNORMAL LOW (ref 3.87–5.11)
RDW: 12.9 % (ref 11.5–15.5)
WBC: 9.2 10*3/uL (ref 4.0–10.5)
nRBC: 0 % (ref 0.0–0.2)

## 2019-09-18 LAB — MAGNESIUM: Magnesium: 2 mg/dL (ref 1.7–2.4)

## 2019-09-18 LAB — POTASSIUM: Potassium: 3.6 mmol/L (ref 3.5–5.1)

## 2019-09-18 LAB — SURGICAL PATHOLOGY

## 2019-09-18 LAB — CREATININE, SERUM
Creatinine, Ser: 0.78 mg/dL (ref 0.44–1.00)
GFR calc Af Amer: >60 mL/min (ref >60)
GFR calc non Af Amer: >60 mL/min (ref >60)

## 2019-09-18 MED ORDER — POTASSIUM CHLORIDE CRYS ER 20 MEQ PO TBCR
40.0000 meq | EXTENDED_RELEASE_TABLET | Freq: Every day | ORAL | Status: AC
  Administered 2019-09-18 – 2019-09-20 (×3): 40 meq via ORAL
  Filled 2019-09-18 (×3): qty 2

## 2019-09-18 MED ORDER — BISMUTH SUBSALICYLATE 262 MG/15ML PO SUSP
30.0000 mL | Freq: Two times a day (BID) | ORAL | Status: DC
  Administered 2019-09-18 (×2): 30 mL via ORAL
  Filled 2019-09-18: qty 236

## 2019-09-18 MED ORDER — LOPERAMIDE HCL 2 MG PO CAPS
4.0000 mg | ORAL_CAPSULE | Freq: Once | ORAL | Status: AC
  Administered 2019-09-18: 4 mg via ORAL
  Filled 2019-09-18: qty 2

## 2019-09-18 MED ORDER — GABAPENTIN 300 MG PO CAPS
300.0000 mg | ORAL_CAPSULE | Freq: Three times a day (TID) | ORAL | Status: DC
  Administered 2019-09-18 – 2019-09-20 (×6): 300 mg via ORAL
  Filled 2019-09-18 (×6): qty 1

## 2019-09-18 MED ORDER — FERROUS SULFATE 325 (65 FE) MG PO TABS
325.0000 mg | ORAL_TABLET | Freq: Two times a day (BID) | ORAL | Status: DC
  Administered 2019-09-18 – 2019-09-20 (×5): 325 mg via ORAL
  Filled 2019-09-18 (×5): qty 1

## 2019-09-18 NOTE — Progress Notes (Addendum)
Stephanie Hoover [id number] May 25, 1956  CARE TEAM:  PCP: System, Pcp Not In  Outpatient Care Team: Patient Care Team: System, Pcp Not In as PCP - General Mercer Pod, DO as Consulting Physician (Gynecology) Michael Boston, MD as Consulting Physician (General Surgery) Irene Shipper, MD as Consulting Physician (Gastroenterology)  Inpatient Treatment Team: Treatment Team: Attending Provider: Michael Boston, MD; Technician: Leda Quail, NT; Technician: Sue Lush, NT; Technician: Shanon Payor, NT; Utilization Review: Sindy Guadeloupe, RN; Registered Nurse: Charlyne Petrin, RN   Problem List:   Principal Problem:   Diverticulitis s/p robotic rectosigmoid resection 09/16/2019 Active Problems:   Diverticulitis large intestine   PONV (postoperative nausea and vomiting)   IBS (irritable bowel syndrome)   Diverticular disease   Complication of anesthesia   Hypomagnesemia   2 Days Post-Op  09/16/2019  POST-OPERATIVE DIAGNOSIS:  RECURRENT SIGMOID DIVERTICULITIS  PROCEDURE:   XI ROBOTIC RESECTION OF RECTOSIGMOID COLON RIGID PROCTOSCOPY TAP BLOCK - BILATERAL  SURGEON:  Adin Hector, MD  PATHOLOGY DIAGNOSIS:   A. COLON, SIGMOID, RESECTION:  - Diverticulitis.  - No dysplasia or malignancy.   B. COLON, FINAL DISTAL MARGIN, BIOPSY:  - Benign colonic mucosa.  - No dysplasia or malignancy.   Assessment  OK  [facility] Stay = 2 days)  Plan:  -PONV - expected despite aggressive regimen - better.  Continue PRNs & ERAS protocolto minimize -Diarrhea in the setting of irritable bowel syndrome and resection of obstructing sigmoid diverticulitis.  Not surprising.  Iron and bismuth to help thicken up stools.  If does not help, try some Imodium. -low Mag - replaced -low K - replace -f/u pathology -consistent with diverticulitis.  No malignancy.  Reassuring -VTE prophylaxis- SCDs, etc -mobilize as tolerated to help recovery  20 minutes spent in review,  evaluation, examination, counseling, and coordination of care.  More than 50% of that time was spent in counseling.  09/18/2019    Subjective: (Chief complaint)  Mildly lightheaded.  IV fluid bolus given.  Tolerating dysphagia 1 diet.  Loose bowel movements.  Feeling better now on soft diet.  Walking hallway with her husband.  Pain better controlled  Objective:  Vital signs:  Vitals:   09/17/19 1316 09/17/19 2225 09/18/19 0500 09/18/19 0614  BP: (!) 92/53 119/67  (!) 97/50  Pulse: 85 80  74  Resp: 17 18  14   Temp:  (!) 97.5 F (36.4 C)  97.7 F (36.5 C)  TempSrc:  Oral  Oral  SpO2: 98% 99%  96%  Weight:   62.3 kg   Height:        Last BM Date: 09/16/19  Intake/Output   Yesterday:  10/01 0701 - 10/02 0700 In: 1939.2 [P.O.:940; IV Piggyback:999.2] Out: 2000 [Urine:2000] This shift:  No intake/output data recorded.  Bowel function:  Flatus: YES  BM:  YES  Drain: (No drain)   Physical Exam:  General: Pt awake/alert/oriented x4 in no acute distress Eyes: PERRL, normal EOM.  Sclera clear.  No icterus Neuro: CN II-XII intact w/o focal sensory/motor deficits. Lymph: No head/neck/groin lymphadenopathy Psych:  No delerium/psychosis/paranoia HENT: Normocephalic, Mucus membranes moist.  No thrush Neck: Supple, No tracheal deviation Chest:  No chest wall pain w good excursion CV:  Pulses intact.  Regular rhythm MS: Normal AROM mjr joints.  No obvious deformity  Abdomen: Soft.  Nondistended.  Mildly tender at incisions only.  No evidence of peritonitis.  No incarcerated hernias.  Ext:   No deformity.  No mjr edema.  No cyanosis  Skin: No petechiae / purpura  Results:   Cultures: Recent Results (from the past 720 hour(s))  Novel Coronavirus, NAA (Hosp order, Send-out to Ref Lab; TAT 18-24 hrs     Status: None   Collection Time: 09/12/19  5:34 PM   Specimen: Nasopharyngeal Swab; Respiratory  Result Value Ref Range Status   SARS-CoV-2, NAA NOT DETECTED  NOT DETECTED Final    Comment: (NOTE) This nucleic acid amplification test was developed and its performance characteristics determined by [employer]. Nucleic acid amplification tests include PCR and TMA. This test has not been FDA cleared or approved. This test has been authorized by FDA under an Emergency Use Authorization (EUA). This test is only authorized for the duration of time the declaration that circumstances exist justifying the authorization of the emergency use of in vitro diagnostic tests for detection of SARS-CoV-2 virus and/or diagnosis of COVID-19 infection under section 564(b)(1) of the Act, 21 U.S.C. [id number]) (1), unless the authorization is terminated or revoked sooner. When diagnostic testing is negative, the possibility of a false negative result should be considered in the context of a patient's recent exposures and the presence of clinical signs and symptoms consistent with COVID-19. An individual without symptoms of COVID- 19 and who is not shedding SARS-CoV-2 vi rus would expect to have a negative (not detected) result in this assay. Performed At: [facility] [address] [place], [place] [id number] Rush Farmer MD [id number]    [place]a  Final    Comment: Performed at [facility], [place] [address], [place], [place] [address]    Labs: Results for orders placed or performed during the hospital encounter of 09/16/19 (from the past 48 hour(s))  Basic metabolic panel     Status: Abnormal   Collection Time: 09/17/19  3:46 AM  Result Value Ref Range   Sodium 134 (L) 135 - 145 mmol/L   Potassium 3.9 3.5 - 5.1 mmol/L   Chloride 106 98 - 111 mmol/L   CO2 22 22 - 32 mmol/L   Glucose, Bld 99 70 - 99 mg/dL   BUN <5 (L) 8 - 23 mg/dL   Creatinine, Ser 0.76 0.44 - 1.00 mg/dL   Calcium 7.7 (L) 8.9 - 10.3 mg/dL   GFR calc non Af Amer >60 >60 mL/min   GFR calc Af Amer >60 >60 mL/min   Anion gap 6 5 - 15     Comment: Performed at [facility], [place] [address], [place], [place] [address]  CBC     Status: Abnormal   Collection Time: 09/17/19  3:46 AM  Result Value Ref Range   WBC 12.9 (H) 4.0 - 10.5 K/uL   RBC 3.44 (L) 3.87 - 5.11 MIL/uL   Hemoglobin 11.0 (L) 12.0 - 15.0 g/dL   HCT 33.5 (L) 36.0 - 46.0 %   MCV 97.4 80.0 - 100.0 fL   MCH 32.0 26.0 - 34.0 pg   MCHC 32.8 30.0 - 36.0 g/dL   RDW 12.3 11.5 - 15.5 %   Platelets 252 150 - 400 K/uL   nRBC 0.0 0.0 - 0.2 %    Comment: Performed at [facility], [place] [address], [place],  [address]  Magnesium     Status: Abnormal   Collection Time: 09/17/19  3:46 AM  Result Value Ref Range   Magnesium 1.6 (L) 1.7 - 2.4 mg/dL    Comment: Performed at [facility], [place] [address] [place],  [address]  Magnesium     Status: None   Collection Time: 09/18/19  7:48 AM  Result Value Ref Range   Magnesium 2.0 1.7 - 2.4 mg/dL    Comment: Performed at [facility], [place] [address], [place], [place] [address]  Potassium     Status: None   Collection Time: 09/18/19  7:48 AM  Result Value Ref Range   Potassium 3.6 3.5 - 5.1 mmol/L    Comment: Performed at [facility], [place] [address] [place], [place] [address]  Creatinine, serum     Status: None   Collection Time: 09/18/19  7:48 AM  Result Value Ref Range   Creatinine, Ser 0.78 0.44 - 1.00 mg/dL   GFR calc non Af Amer >60 >60 mL/min   GFR calc Af Amer >60 >60 mL/min    Comment: Performed at [facility], [place] [address] [place], [place] [address]  CBC     Status: Abnormal   Collection Time: 09/18/19  7:48 AM  Result Value Ref Range   WBC 9.2 4.0 - 10.5 K/uL   RBC 3.28 (L) 3.87 - 5.11 MIL/uL   Hemoglobin 10.7 (L) 12.0 - 15.0 g/dL   HCT 32.3 (L) 36.0 - 46.0 %   MCV 98.5 80.0 - 100.0 fL   MCH 32.6 26.0 - 34.0 pg   MCHC 33.1 30.0 - 36.0 g/dL   RDW 12.9 11.5 - 15.5 %   Platelets  221 150 - 400 K/uL   nRBC 0.0 0.0 - 0.2 %    Comment: Performed at [facility], [place] [address], [place], [place] [address]    Imaging / Studies: No results found.  Medications / Allergies: per chart  Antibiotics: Anti-infectives (From admission, onward)   Start     Dose/Rate Route Frequency Ordered Stop   09/16/19 2100  cefoTEtan (CEFOTAN) 2 g in sodium chloride 0.9 % 100 mL IVPB     2 g 200 mL/hr over 30 Minutes Intravenous Every 12 hours 09/16/19 1323 09/16/19 2142   09/16/19 1400  neomycin (MYCIFRADIN) tablet 1,000 mg  Status:  Discontinued     1,000 mg Oral 3 times per day 09/16/19 0631 09/16/19 0633   09/16/19 1400  metroNIDAZOLE (FLAGYL) tablet 1,000 mg  Status:  Discontinued     1,000 mg Oral 3 times per day 09/16/19 0631 09/16/19 0633   09/16/19 1400  neomycin (MYCIFRADIN) tablet 1,000 mg  Status:  Discontinued     1,000 mg Oral 3 times per day 09/16/19 0631 09/16/19 0633   09/16/19 1400  metroNIDAZOLE (FLAGYL) tablet 1,000 mg  Status:  Discontinued     1,000 mg Oral 3 times per day 09/16/19 0631 09/16/19 0633   09/16/19 1037  clindamycin (CLEOCIN) 900 mg, gentamicin (GARAMYCIN) 240 mg in sodium chloride 0.9 % 1,000 mL for intraperitoneal lavage  Status:  Discontinued       As needed 09/16/19 1037 09/16/19 1122   09/16/19 0645  cefoTEtan (CEFOTAN) 2 g in sodium chloride 0.9 % 100 mL IVPB     2 g 200 mL/hr over 30 Minutes Intravenous On call to O.R. 09/16/19 0631 09/16/19 [id number]   09/16/19 0645  cefoTEtan (CEFOTAN) 2 g in sodium chloride 0.9 % 100 mL IVPB  Status:  Discontinued     2 g 200 mL/hr over 30 Minutes Intravenous On call to O.R. 09/16/19 0631 09/16/19 [id number]   09/16/19 0645  clindamycin (CLEOCIN) 900 mg, gentamicin (GARAMYCIN) 240 mg in sodium chloride 0.9 %  1,000 mL for intraperitoneal lavage  Status:  Discontinued      Irrigation To Surgery 09/16/19 0631 09/16/19 0633   09/16/19 0600  clindamycin (CLEOCIN) 900 mg, gentamicin (GARAMYCIN) 240 mg in  sodium chloride 0.9 % 1,000 mL for intraperitoneal lavage  Status:  Discontinued      Irrigation To Surgery 09/15/19 [id number] 09/16/19 1301        Note: Portions of this report may have been transcribed using voice recognition software. Every effort was made to ensure accuracy; however, inadvertent computerized transcription errors may be present.   Any transcriptional errors that result from this process are unintentional.     Adin Hector, MD, FACS, MASCRS Gastrointestinal and Minimally Invasive Surgery    [address] [place], [place] [address] [phone] Main / Paging [phone] Fax

## 2019-09-18 NOTE — Progress Notes (Signed)
Patient with sigmoid inflammation and stricturing.  Underwent robotic resection.  Pathology consistent with diverticulitis.  Message relayed to patient

## 2019-09-19 MED ORDER — LOPERAMIDE HCL 2 MG PO CAPS
2.0000 mg | ORAL_CAPSULE | Freq: Two times a day (BID) | ORAL | Status: DC
  Administered 2019-09-19: 21:00:00 2 mg via ORAL
  Filled 2019-09-19 (×2): qty 1

## 2019-09-19 NOTE — Progress Notes (Signed)
Progress Note: General Surgery Service   Chief Complaint/Subjective: Pain moderate, diarrhea yesterday, better overnight after pepto and imodium  Objective: Vital signs in last 24 hours: Temp:  [97.8 F (36.6 C)-98 F (36.7 C)] 98 F (36.7 C) (10/03 0527) Pulse Rate:  [72-87] 73 (10/03 0527) Resp:  [16-18] 16 (10/03 0527) BP: (96-108)/(51-62) 96/62 (10/03 0527) SpO2:  [97 %-100 %] 97 % (10/03 0527) Weight:  [60.7 kg] 60.7 kg (10/03 0531) Last BM Date: 09/18/19  Intake/Output from previous day: 10/02 0701 - 10/03 0700 In: 840 [P.O.:840] Out: 200 [Urine:200] Intake/Output this shift: No intake/output data recorded.  Lungs: nonlabored  Cardiovascular: RRR  Abd: soft, ATTP, incision c/d/i, slight redness around umbilicus  Extremities: no edema  Neuro: AOx4  Lab Results: CBC  Recent Labs    09/17/19 0346 09/18/19 0748  WBC 12.9* 9.2  HGB 11.0* 10.7*  HCT 33.5* 32.3*  PLT 252 221   BMET Recent Labs    09/17/19 0346 09/18/19 0748  NA 134*  --   K 3.9 3.6  CL 106  --   CO2 22  --   GLUCOSE 99  --   BUN <5*  --   CREATININE 0.76 0.78  CALCIUM 7.7*  --    PT/INR No results for input(s): LABPROT, INR in the last 72 hours. ABG No results for input(s): PHART, HCO3 in the last 72 hours.  Invalid input(s): PCO2, PO2  Studies/Results:  Anti-infectives: Anti-infectives (From admission, onward)   Start     Dose/Rate Route Frequency Ordered Stop   09/16/19 2100  cefoTEtan (CEFOTAN) 2 g in sodium chloride 0.9 % 100 mL IVPB     2 g 200 mL/hr over 30 Minutes Intravenous Every 12 hours 09/16/19 1323 09/16/19 2142   09/16/19 1400  neomycin (MYCIFRADIN) tablet 1,000 mg  Status:  Discontinued     1,000 mg Oral 3 times per day 09/16/19 0631 09/16/19 0633   09/16/19 1400  metroNIDAZOLE (FLAGYL) tablet 1,000 mg  Status:  Discontinued     1,000 mg Oral 3 times per day 09/16/19 0631 09/16/19 0633   09/16/19 1400  neomycin (MYCIFRADIN) tablet 1,000 mg  Status:   Discontinued     1,000 mg Oral 3 times per day 09/16/19 0631 09/16/19 0633   09/16/19 1400  metroNIDAZOLE (FLAGYL) tablet 1,000 mg  Status:  Discontinued     1,000 mg Oral 3 times per day 09/16/19 0631 09/16/19 0633   09/16/19 1037  clindamycin (CLEOCIN) 900 mg, gentamicin (GARAMYCIN) 240 mg in sodium chloride 0.9 % 1,000 mL for intraperitoneal lavage  Status:  Discontinued       As needed 09/16/19 1037 09/16/19 1122   09/16/19 0645  cefoTEtan (CEFOTAN) 2 g in sodium chloride 0.9 % 100 mL IVPB     2 g 200 mL/hr over 30 Minutes Intravenous On call to O.R. 09/16/19 0631 09/16/19 [id number]   09/16/19 0645  cefoTEtan (CEFOTAN) 2 g in sodium chloride 0.9 % 100 mL IVPB  Status:  Discontinued     2 g 200 mL/hr over 30 Minutes Intravenous On call to O.R. 09/16/19 0631 09/16/19 [id number]   09/16/19 0645  clindamycin (CLEOCIN) 900 mg, gentamicin (GARAMYCIN) 240 mg in sodium chloride 0.9 % 1,000 mL for intraperitoneal lavage  Status:  Discontinued      Irrigation To Surgery 09/16/19 0631 09/16/19 0633   09/16/19 0600  clindamycin (CLEOCIN) 900 mg, gentamicin (GARAMYCIN) 240 mg in sodium chloride 0.9 % 1,000 mL for intraperitoneal lavage  Status:  Discontinued  Irrigation To Surgery 09/15/19 0917 09/16/19 1301      Medications: Scheduled Meds: . acetaminophen  1,000 mg Oral Q6H  . acidophilus  1 capsule Oral BID  . bismuth subsalicylate  30 mL Oral BID  . enoxaparin (LOVENOX) injection  40 mg Subcutaneous Q24H  . estradiol  0.5 mg Oral Daily  . estradiol  0.5 Applicatorful Vaginal 2 times weekly  . feeding supplement  237 mL Oral BID BM  . ferrous sulfate  325 mg Oral BID WC  . fluticasone  1 spray Each Nare Daily  . gabapentin  300 mg Oral TID  . lip balm  1 application Topical BID  . loperamide  2 mg Oral BID  . loratadine  10 mg Oral Daily  . potassium chloride  40 mEq Oral Daily   Continuous Infusions: PRN Meds:.alum & mag hydroxide-simeth, diphenhydrAMINE **OR** diphenhydrAMINE, HYDROmorphone  (DILAUDID) injection, LORazepam, magic mouthwash, metoprolol tartrate, ondansetron **OR** ondansetron (ZOFRAN) IV, traMADol  Assessment/Plan: Patient Active Problem List   Diagnosis Date Noted  . Hypokalemia 09/18/2019  . PONV (postoperative nausea and vomiting) 09/17/2019  . Hypomagnesemia 09/17/2019  . IBS (irritable bowel syndrome)   . Diverticular disease   . Complication of anesthesia   . Atopic dermatitis, unspecified 09/16/2019  . Diverticulitis large intestine 09/16/2019  . Diverticulitis s/p robotic rectosigmoid resection 09/16/2019 12/25/2018  . Allergic rhinitis due to other allergen 05/07/2013   s/p Procedure(s): XI ROBOTIC RESECTION OF RECTOSIGMOID COLON, RIGID PROCTOSCOPY 09/16/2019 Tolerating diet well Diarrhea improved after imodium, will continue at decreased dose today Ambulate VTE prophylaxis    LOS: 3 days   Mickeal Skinner, MD Pg# [phone] [facility]

## 2019-09-20 MED ORDER — LOPERAMIDE HCL 2 MG PO CAPS: 4.0000 mg | ORAL_CAPSULE | Freq: Four times a day (QID) | ORAL | 2 refills | Status: AC | PRN

## 2019-09-20 NOTE — Discharge Summary (Signed)
Physician Discharge Summary  Patient ID: Stephanie Hoover MRN: [id number] DOB/AGE: 1956/02/06 63 y.o.  Admit date: 09/16/2019 Discharge date: 09/20/2019  Admission Diagnoses:  Discharge Diagnoses:  Principal Problem:   Diverticulitis s/p robotic rectosigmoid resection 09/16/2019 Active Problems:   Diverticulitis large intestine   PONV (postoperative nausea and vomiting)   IBS (irritable bowel syndrome)   Diverticular disease   Complication of anesthesia   Hypomagnesemia   Hypokalemia   Discharged Condition: good  Hospital Course: She presented for rectal resection. Post op she was admitted for ERAS protocol. She did well and was quickly advanced on diet. She had significant diarrhea and was started on imodium with some control. She was discharged home POD 4  Consults: None  Significant Diagnostic Studies:  CBC    Component Value Date/Time   WBC 9.2 09/18/2019 0748   RBC 3.28 (L) 09/18/2019 0748   HGB 10.7 (L) 09/18/2019 0748   HCT 32.3 (L) 09/18/2019 0748   PLT 221 09/18/2019 0748   MCV 98.5 09/18/2019 0748   MCH 32.6 09/18/2019 0748   MCHC 33.1 09/18/2019 0748   RDW 12.9 09/18/2019 0748   LYMPHSABS 1.8 12/29/2018 0412   MONOABS 0.5 12/29/2018 0412   EOSABS 0.3 12/29/2018 0412   BASOSABS 0.0 12/29/2018 0412     Treatments: IV hydration and surgery: robotic LAR  Discharge Exam: Blood pressure 111/60, pulse 78, temperature 97.9 F (36.6 C), temperature source Oral, resp. rate 16, height 5\' 1"  (1.549 m), weight 60.7 kg, SpO2 98 %. General appearance: alert and cooperative Head: Normocephalic, without obvious abnormality, atraumatic Neck: no adenopathy, no carotid bruit, no JVD and supple, symmetrical, trachea midline GI: incisions c/d/i, some tenderness right lower quadrant  Disposition: Discharge disposition: 01-Home or Self Care       Discharge Instructions    Call MD for:  extreme fatigue   Complete by: As directed    Call MD for:  persistant nausea and  vomiting   Complete by: As directed    Call MD for:  redness, tenderness, or signs of infection (pain, swelling, redness, odor or green/yellow discharge around incision site)   Complete by: As directed    Call MD for:  severe uncontrolled pain   Complete by: As directed    Call MD for:  temperature >100.4   Complete by: As directed    Diet - low sodium heart healthy   Complete by: As directed    Increase activity slowly   Complete by: As directed      Allergies as of 09/20/2019      Reactions   Codeine Nausea And Vomiting   Morphine Nausea And Vomiting   Promethazine    Other reaction(s): Muscle Pain Muscle twitching   Meperidine Nausea And Vomiting   Sulfadiazine Rash      Medication List    TAKE these medications   dicyclomine 20 MG tablet Commonly known as: BENTYL Take 20 mg by mouth every 6 (six) hours as needed for spasms.   estradiol 0.1 MG/GM vaginal cream Commonly known as: ESTRACE Place 0.5 Applicatorfuls vaginally 2 (two) times a week.   estradiol 0.5 MG tablet Commonly known as: ESTRACE Take 0.5 mg by mouth daily.   fluticasone 50 MCG/ACT nasal spray Commonly known as: FLONASE Place 1 spray into both nostrils daily.   HEALTHY LIVER PO Take 2 tablets by mouth daily.   loperamide 2 MG capsule Commonly known as: IMODIUM Take 2 capsules (4 mg total) by mouth every 6 (six) hours as needed  for diarrhea or loose stools. What changed:   how much to take  when to take this   loratadine 10 MG tablet Commonly known as: CLARITIN Take 10 mg by mouth daily.   PROBIOTIC DAILY PO Take 1 capsule by mouth 2 (two) times daily.      Follow-up Information    Michael Boston, MD. Schedule an appointment as soon as possible for a visit in 3 weeks.   Specialty: General Surgery Why: To follow up after your operation, To follow up after your hospital stay Contact information: [address] [place] [address] [phone]            Signed: Arta Bruce Indonesia Mckeough 09/20/2019, 8:08 AM

## 2020-10-03 IMAGING — CT CT ABD-PELV W/ CM
2 of 5 series · 16 of 46 positions shown, 18 images · IV contrast (ISOVUE 300)
Comparison: CT the abdomen and pelvis 12/24/2018.

CLINICAL DATA: 62-year-old female with history of diverticulitis
with diverticular abscess. Treated with rounds of antibiotics.
Follow-up study.

EXAM:
CT ABDOMEN AND PELVIS WITH CONTRAST
TECHNIQUE: Multidetector CT imaging of the abdomen and pelvis was performed
using the standard protocol following bolus administration of
intravenous contrast.
CONTRAST:  100mL 6C3SLC-9CC IOPAMIDOL (6C3SLC-9CC) INJECTION 61%

[Series 2: abd/pel w · axial · 0.68mm/px · z∈[-639,-289]mm · 13 of 80 slices shown, 15 images]
[im 5/80  soft-tissue]
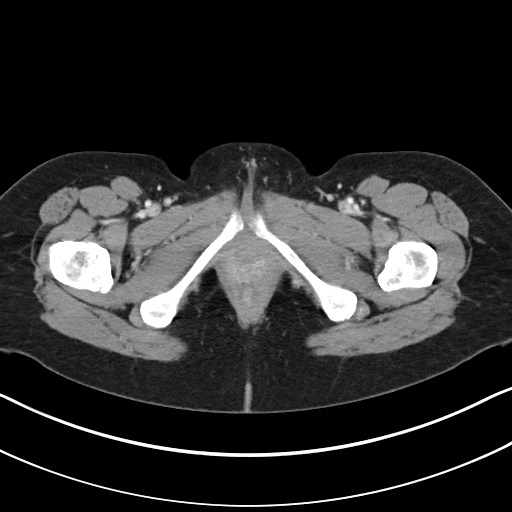
[im 5/80  bone]
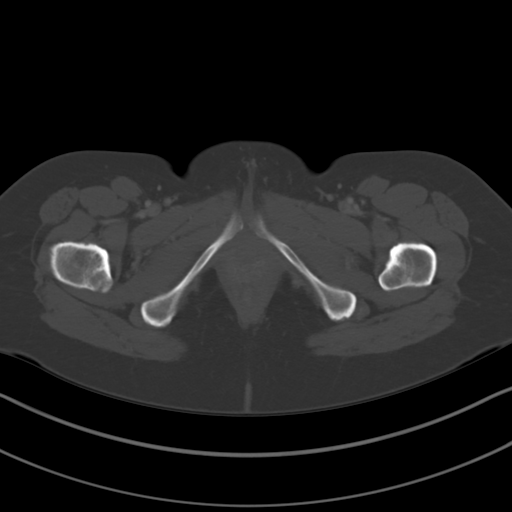
[im 13/80  soft-tissue]
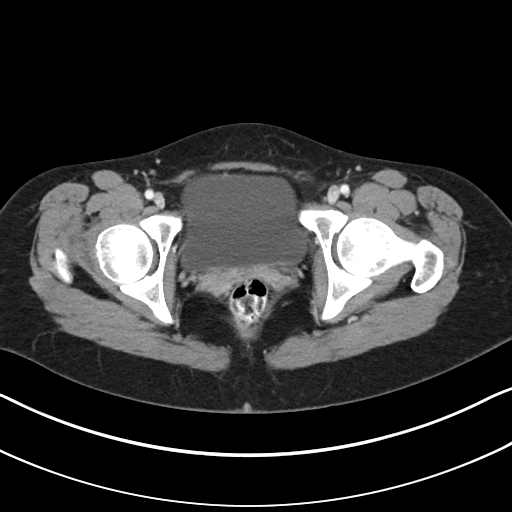
[im 17/80  soft-tissue]
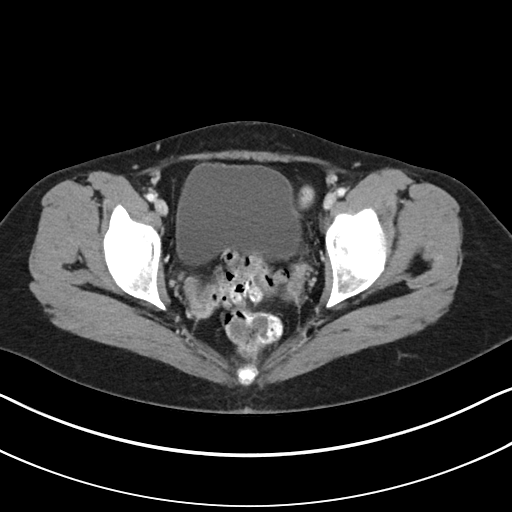
[im 21/80  soft-tissue]
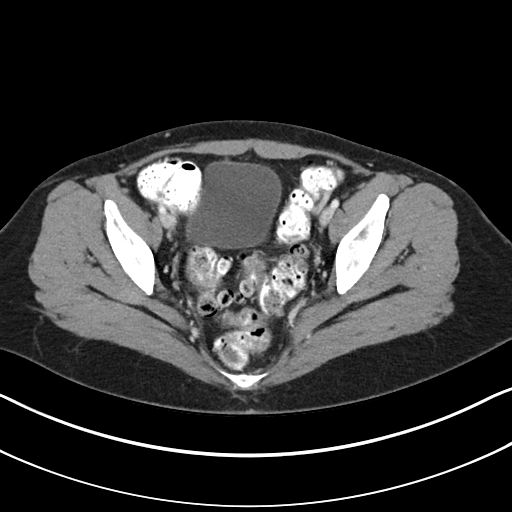
[im 30/80  soft-tissue]
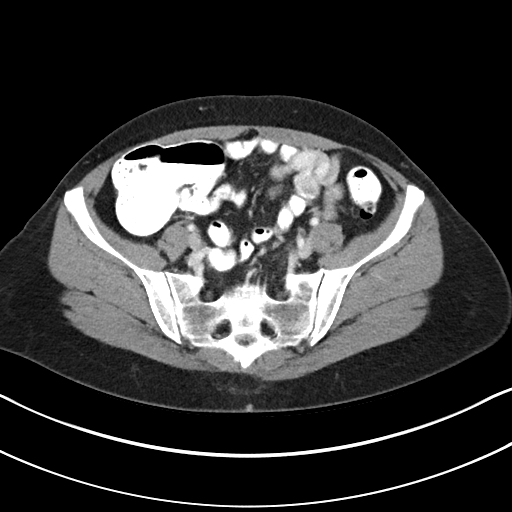
[im 34/80  soft-tissue]
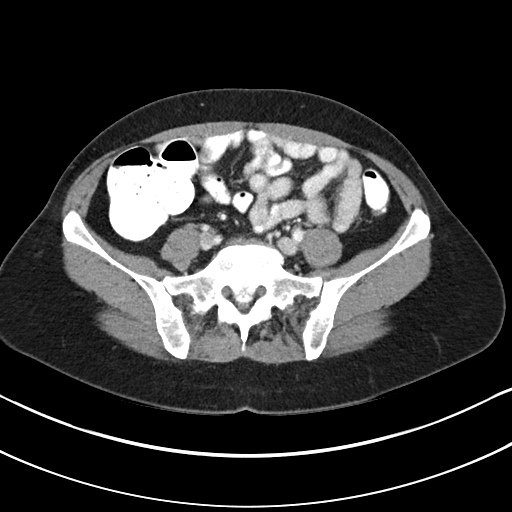
[im 42/80  soft-tissue]
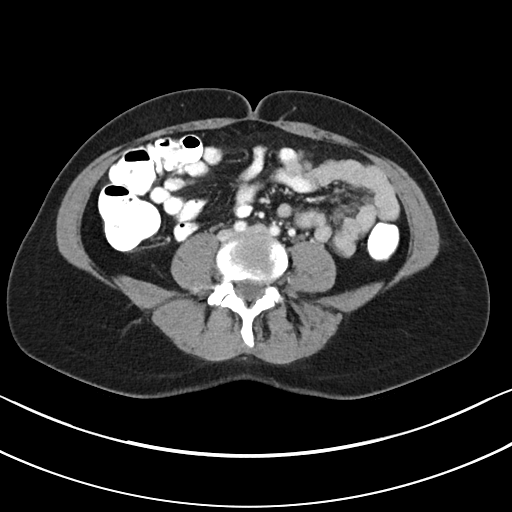
[im 46/80  soft-tissue]
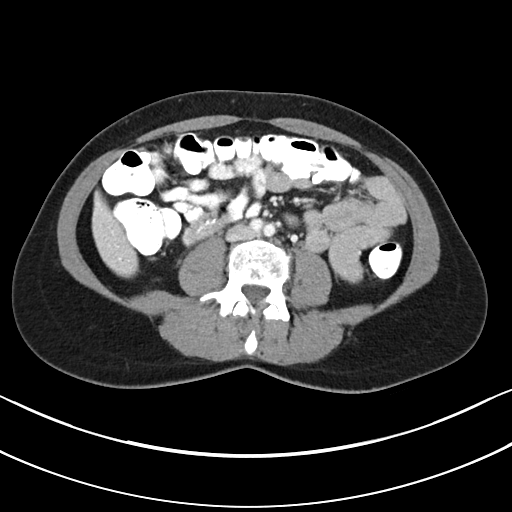
[im 50/80  soft-tissue]
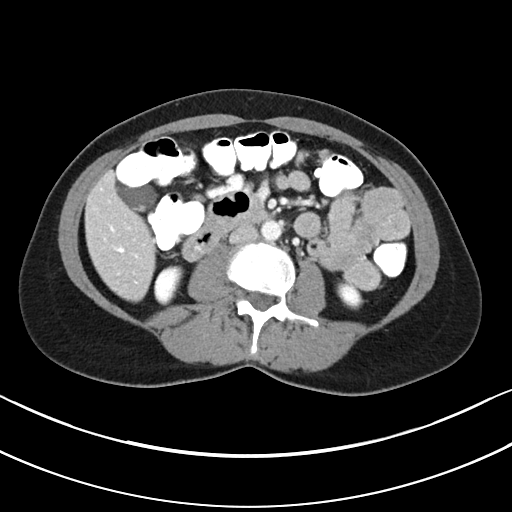
[im 50/80  bone]
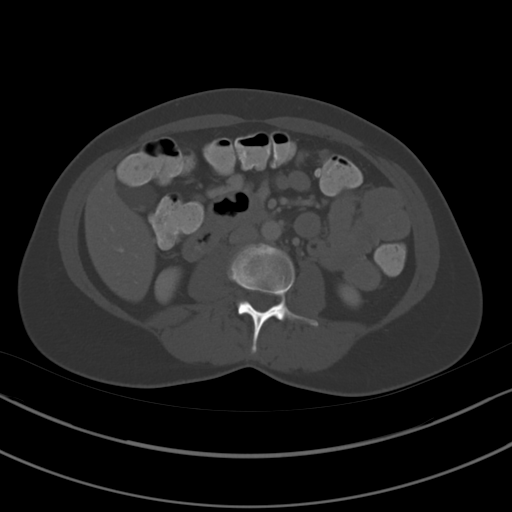
[im 59/80  soft-tissue]
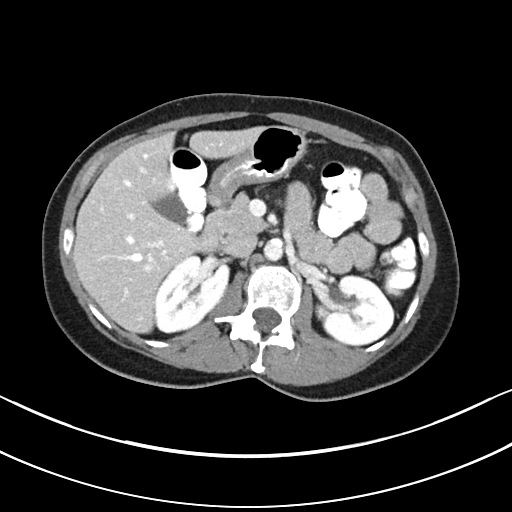
[im 63/80  soft-tissue]
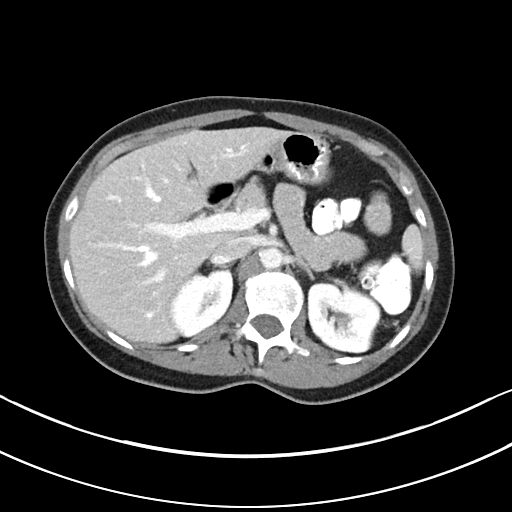
[im 67/80  soft-tissue]
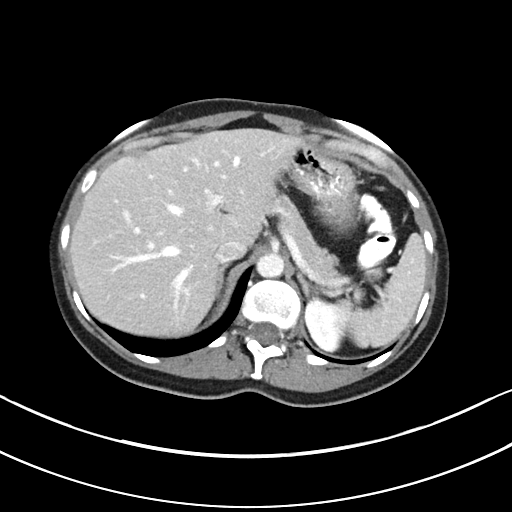
[im 75/80  soft-tissue]
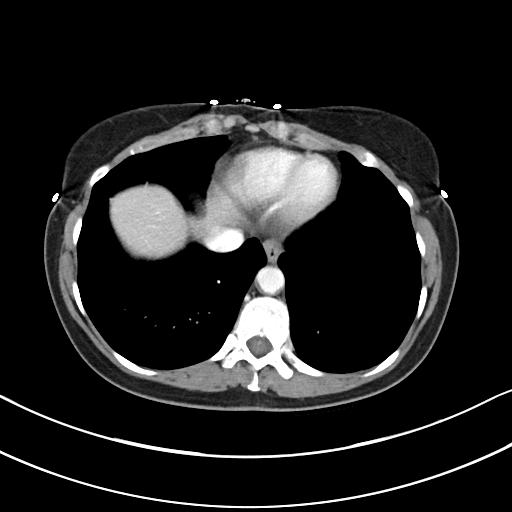

[Series 6: abd/pel w st · coronal · 0.69mm/px · 3 of 81 slices shown]
[im 27/81  soft-tissue]
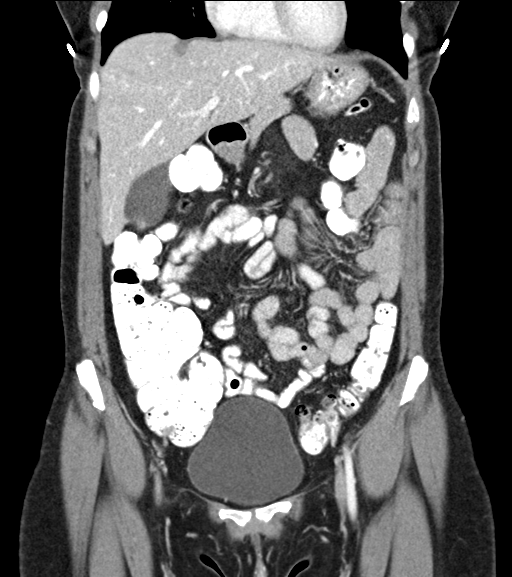
[im 36/81  soft-tissue]
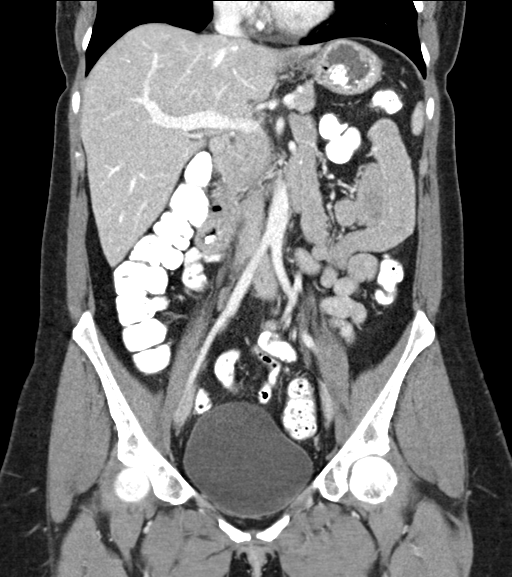
[im 45/81  soft-tissue]
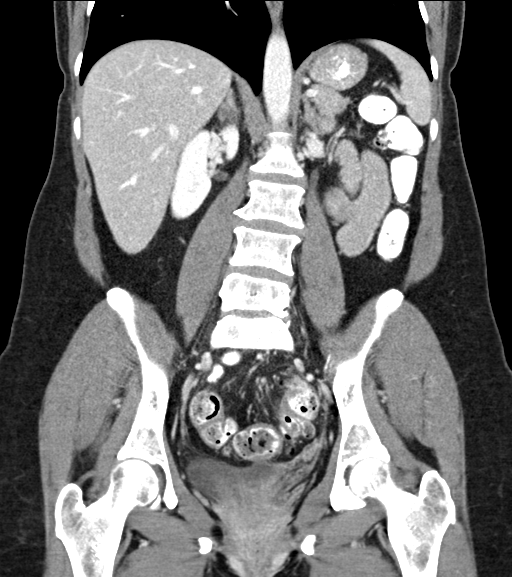

[16 of 46 positions shown; findings below may reference images not displayed]

FINDINGS: Lower chest: Unremarkable.

Hepatobiliary: Subcentimeter low-attenuation lesion in segment 8 of
the liver, too small to characterize, but stable compared to the
prior study and statistically likely tiny cysts. No other larger
more suspicious appearing up attic lesions. No intra or extrahepatic
biliary ductal dilatation. Small calcified gallstones lying
dependently in the gallbladder measuring up to 7 mm. No findings to
suggest an acute cholecystitis at this time.

Pancreas: No pancreatic mass. No pancreatic ductal dilatation. No
pancreatic or peripancreatic fluid or inflammatory changes.

Spleen: Unremarkable.

Adrenals/Urinary Tract: Bilateral kidneys and bilateral adrenal
glands are normal in appearance. No hydroureteronephrosis. Urinary
bladder is normal in appearance.

Stomach/Bowel: Normal appearance of the stomach. No pathologic
dilatation of small bowel or colon. Numerous colonic diverticulae
are noted, however, the inflammatory changes in the region of the
sigmoid colon seen on the prior study have resolved, indicative of a
completely treated diverticulitis. No new inflammatory changes or
diverticular abscess identified on today's examination. The appendix
is not confidently identified and may be surgically absent.
Regardless, there are no inflammatory changes noted adjacent to the
cecum to suggest the presence of an acute appendicitis at this time.

Vascular/Lymphatic: Aortic atherosclerosis, without evidence of
aneurysm or dissection in the abdominal or pelvic vasculature. No
lymphadenopathy noted in the abdomen or pelvis.

Reproductive: Status post hysterectomy. Ovaries are not confidently
identified may be surgically absent or atrophic.

Other: No significant volume of ascites.  No pneumoperitoneum.

Musculoskeletal: There are no aggressive appearing lytic or blastic
lesions noted in the visualized portions of the skeleton.
IMPRESSION: 1. Colonic diverticulosis with successful treatment of previously
noted diverticulitis and small diverticular abscess. No signs of
residual inflammation are noted on today's examination.
2. Cholelithiasis without evidence of acute cholecystitis at this
time.
3. Aortic atherosclerosis.

## 2020-10-28 DIAGNOSIS — J3089 Other allergic rhinitis: Secondary | ICD-10-CM | POA: Diagnosis not present

## 2020-10-28 DIAGNOSIS — J301 Allergic rhinitis due to pollen: Secondary | ICD-10-CM | POA: Diagnosis not present

## 2020-10-31 DIAGNOSIS — J301 Allergic rhinitis due to pollen: Secondary | ICD-10-CM | POA: Diagnosis not present

## 2020-10-31 DIAGNOSIS — J3089 Other allergic rhinitis: Secondary | ICD-10-CM | POA: Diagnosis not present

## 2020-11-07 DIAGNOSIS — J301 Allergic rhinitis due to pollen: Secondary | ICD-10-CM | POA: Diagnosis not present

## 2020-11-07 DIAGNOSIS — J3089 Other allergic rhinitis: Secondary | ICD-10-CM | POA: Diagnosis not present

## 2020-11-15 DIAGNOSIS — J301 Allergic rhinitis due to pollen: Secondary | ICD-10-CM | POA: Diagnosis not present

## 2020-11-15 DIAGNOSIS — J3089 Other allergic rhinitis: Secondary | ICD-10-CM | POA: Diagnosis not present

## 2020-11-22 DIAGNOSIS — J301 Allergic rhinitis due to pollen: Secondary | ICD-10-CM | POA: Diagnosis not present

## 2020-11-22 DIAGNOSIS — J3089 Other allergic rhinitis: Secondary | ICD-10-CM | POA: Diagnosis not present

## 2020-11-28 DIAGNOSIS — Z13220 Encounter for screening for lipoid disorders: Secondary | ICD-10-CM | POA: Diagnosis not present

## 2020-11-28 DIAGNOSIS — G4700 Insomnia, unspecified: Secondary | ICD-10-CM | POA: Diagnosis not present

## 2020-11-28 DIAGNOSIS — J3089 Other allergic rhinitis: Secondary | ICD-10-CM | POA: Diagnosis not present

## 2020-11-28 DIAGNOSIS — Z0000 Encounter for general adult medical examination without abnormal findings: Secondary | ICD-10-CM | POA: Diagnosis not present

## 2020-11-28 DIAGNOSIS — J301 Allergic rhinitis due to pollen: Secondary | ICD-10-CM | POA: Diagnosis not present

## 2020-11-28 DIAGNOSIS — R208 Other disturbances of skin sensation: Secondary | ICD-10-CM | POA: Diagnosis not present

## 2020-11-28 DIAGNOSIS — M79644 Pain in right finger(s): Secondary | ICD-10-CM | POA: Diagnosis not present

## 2020-11-28 DIAGNOSIS — Z13820 Encounter for screening for osteoporosis: Secondary | ICD-10-CM | POA: Diagnosis not present

## 2020-12-07 DIAGNOSIS — R208 Other disturbances of skin sensation: Secondary | ICD-10-CM | POA: Diagnosis not present

## 2020-12-07 DIAGNOSIS — Z13220 Encounter for screening for lipoid disorders: Secondary | ICD-10-CM | POA: Diagnosis not present

## 2020-12-22 DIAGNOSIS — Z20822 Contact with and (suspected) exposure to covid-19: Secondary | ICD-10-CM | POA: Diagnosis not present

## 2020-12-23 DIAGNOSIS — J111 Influenza due to unidentified influenza virus with other respiratory manifestations: Secondary | ICD-10-CM | POA: Diagnosis not present

## 2020-12-26 DIAGNOSIS — J3089 Other allergic rhinitis: Secondary | ICD-10-CM | POA: Diagnosis not present

## 2020-12-26 DIAGNOSIS — J069 Acute upper respiratory infection, unspecified: Secondary | ICD-10-CM | POA: Diagnosis not present

## 2020-12-26 DIAGNOSIS — J301 Allergic rhinitis due to pollen: Secondary | ICD-10-CM | POA: Diagnosis not present

## 2021-01-04 DIAGNOSIS — S63632A Sprain of interphalangeal joint of right middle finger, initial encounter: Secondary | ICD-10-CM | POA: Diagnosis not present

## 2021-01-04 DIAGNOSIS — M79641 Pain in right hand: Secondary | ICD-10-CM | POA: Diagnosis not present

## 2021-01-10 DIAGNOSIS — S63632A Sprain of interphalangeal joint of right middle finger, initial encounter: Secondary | ICD-10-CM | POA: Diagnosis not present

## 2021-01-10 DIAGNOSIS — M25641 Stiffness of right hand, not elsewhere classified: Secondary | ICD-10-CM | POA: Diagnosis not present

## 2021-01-10 DIAGNOSIS — R29898 Other symptoms and signs involving the musculoskeletal system: Secondary | ICD-10-CM | POA: Diagnosis not present

## 2021-01-10 DIAGNOSIS — M79641 Pain in right hand: Secondary | ICD-10-CM | POA: Diagnosis not present

## 2021-01-24 DIAGNOSIS — J301 Allergic rhinitis due to pollen: Secondary | ICD-10-CM | POA: Diagnosis not present

## 2021-01-24 DIAGNOSIS — J3089 Other allergic rhinitis: Secondary | ICD-10-CM | POA: Diagnosis not present

## 2021-01-24 DIAGNOSIS — M79641 Pain in right hand: Secondary | ICD-10-CM | POA: Diagnosis not present

## 2021-01-24 DIAGNOSIS — M25641 Stiffness of right hand, not elsewhere classified: Secondary | ICD-10-CM | POA: Diagnosis not present

## 2021-01-24 DIAGNOSIS — S63632A Sprain of interphalangeal joint of right middle finger, initial encounter: Secondary | ICD-10-CM | POA: Diagnosis not present

## 2021-01-24 DIAGNOSIS — R29898 Other symptoms and signs involving the musculoskeletal system: Secondary | ICD-10-CM | POA: Diagnosis not present

## 2021-01-30 DIAGNOSIS — Z13820 Encounter for screening for osteoporosis: Secondary | ICD-10-CM | POA: Diagnosis not present

## 2021-01-30 DIAGNOSIS — Z780 Asymptomatic menopausal state: Secondary | ICD-10-CM | POA: Diagnosis not present

## 2021-01-31 DIAGNOSIS — S63632A Sprain of interphalangeal joint of right middle finger, initial encounter: Secondary | ICD-10-CM | POA: Diagnosis not present

## 2021-01-31 DIAGNOSIS — R29898 Other symptoms and signs involving the musculoskeletal system: Secondary | ICD-10-CM | POA: Diagnosis not present

## 2021-01-31 DIAGNOSIS — M25641 Stiffness of right hand, not elsewhere classified: Secondary | ICD-10-CM | POA: Diagnosis not present

## 2021-01-31 DIAGNOSIS — M79641 Pain in right hand: Secondary | ICD-10-CM | POA: Diagnosis not present

## 2021-02-07 DIAGNOSIS — S63632A Sprain of interphalangeal joint of right middle finger, initial encounter: Secondary | ICD-10-CM | POA: Diagnosis not present

## 2021-02-07 DIAGNOSIS — M25641 Stiffness of right hand, not elsewhere classified: Secondary | ICD-10-CM | POA: Diagnosis not present

## 2021-02-07 DIAGNOSIS — R29898 Other symptoms and signs involving the musculoskeletal system: Secondary | ICD-10-CM | POA: Diagnosis not present

## 2021-02-21 DIAGNOSIS — M79641 Pain in right hand: Secondary | ICD-10-CM | POA: Diagnosis not present

## 2021-02-21 DIAGNOSIS — J3089 Other allergic rhinitis: Secondary | ICD-10-CM | POA: Diagnosis not present

## 2021-02-21 DIAGNOSIS — J301 Allergic rhinitis due to pollen: Secondary | ICD-10-CM | POA: Diagnosis not present

## 2021-02-21 DIAGNOSIS — M25641 Stiffness of right hand, not elsewhere classified: Secondary | ICD-10-CM | POA: Diagnosis not present

## 2021-02-21 DIAGNOSIS — R29898 Other symptoms and signs involving the musculoskeletal system: Secondary | ICD-10-CM | POA: Diagnosis not present

## 2021-02-21 DIAGNOSIS — S63632A Sprain of interphalangeal joint of right middle finger, initial encounter: Secondary | ICD-10-CM | POA: Diagnosis not present

## 2021-03-07 DIAGNOSIS — J301 Allergic rhinitis due to pollen: Secondary | ICD-10-CM | POA: Diagnosis not present

## 2021-03-07 DIAGNOSIS — J3089 Other allergic rhinitis: Secondary | ICD-10-CM | POA: Diagnosis not present

## 2021-04-10 DIAGNOSIS — J3089 Other allergic rhinitis: Secondary | ICD-10-CM | POA: Diagnosis not present

## 2021-04-10 DIAGNOSIS — J301 Allergic rhinitis due to pollen: Secondary | ICD-10-CM | POA: Diagnosis not present

## 2021-04-12 ENCOUNTER — Ambulatory Visit: Payer: Federal, State, Local not specified - PPO | Admitting: Physician Assistant

## 2021-04-12 ENCOUNTER — Encounter: Payer: Self-pay | Admitting: Physician Assistant

## 2021-04-12 ENCOUNTER — Other Ambulatory Visit (INDEPENDENT_AMBULATORY_CARE_PROVIDER_SITE_OTHER): Payer: Federal, State, Local not specified - PPO

## 2021-04-12 VITALS — BP 120/70 | HR 85 | Ht 61.0 in | Wt 135.0 lb

## 2021-04-12 DIAGNOSIS — K589 Irritable bowel syndrome without diarrhea: Secondary | ICD-10-CM

## 2021-04-12 DIAGNOSIS — R1030 Lower abdominal pain, unspecified: Secondary | ICD-10-CM | POA: Diagnosis not present

## 2021-04-12 LAB — BASIC METABOLIC PANEL
BUN: 16 mg/dL (ref 6–23)
CO2: 28 mEq/L (ref 19–32)
Calcium: 9.2 mg/dL (ref 8.4–10.5)
Chloride: 104 mEq/L (ref 96–112)
Creatinine, Ser: 0.82 mg/dL (ref 0.40–1.20)
GFR: 75.83 mL/min (ref >60.00)
Glucose, Bld: 81 mg/dL (ref 70–99)
Potassium: 4.1 mEq/L (ref 3.5–5.1)
Sodium: 139 mEq/L (ref 135–145)

## 2021-04-12 MED ORDER — DICYCLOMINE HCL 20 MG PO TABS: 20.0000 mg | ORAL_TABLET | Freq: Four times a day (QID) | ORAL | 2 refills | Status: DC | PRN

## 2021-04-12 NOTE — Progress Notes (Signed)
Subjective:    Patient ID: Stephanie Hoover, female    DOB: 1956/02/21, 65 y.o.   MRN: [id number]  HPI Stephanie Hoover is a pleasant 24 year old white female, known to Dr. Henrene Pastor, who comes in today with complaints of intermittent episodes of sharp lower abdominal pain which have occurred on 8-10 occasions since she underwent sigmoid resection in September 2020 for recurrent diverticulitis. She says these episodes occur generally with bending over, like being in a sitting position and bending over to put on a shoe and usually occurs when she is doing something with her left leg.  She says she gets a very sharp pain in the lower abdomen that feels as if something is "flipping" or twisting.  She generally has to lie down and try to stretch out, and the sensation generally last for a few minutes then subsides.  She does not have any changes in her bowel habits with these episodes, no nausea or vomiting. She also had a fairly recent episode a few weeks back with lower abdominal discomfort which had lasted for almost 2 weeks.  She says there was some tenderness and dull discomfort in the lower abdomen which felt irritated "".  She did not have any associated fever or chills, no diarrhea melena or hematochezia, no nausea or vomiting.  That has since subsided and has not recurred.  She says she has had that sensation in the past sometimes waking her earlier in the mornings.  She has been taking a fiber supplement regularly.  She has an old prescription for dicyclomine but had not tried that recently.  She says this discomfort did remind her prior episodes of diverticulitis. Last colonoscopy was done February 2020 with removal of 3 small polyps largest 3 mm and was noted to have multiple diverticuli from the sigmoid to the ascending colon.  Path from the polyp showed 2 tubular adenomas and 1 benign polypoid mucosa.  She is indicated for 5-year interval follow-up.  Patient also has history of endometriosis and is status post  hysterectomy.  Review of Systems Pertinent positive and negative review of systems were noted in the above HPI section.  All other review of systems was otherwise negative.  Outpatient Encounter Medications as of 04/12/2021  Medication Sig  . atorvastatin (LIPITOR) 20 MG tablet Take 1 tablet by mouth daily.  Marland Kitchen estradiol (ESTRACE) 0.1 MG/GM vaginal cream Place 0.5 Applicatorfuls vaginally 2 (two) times a week.   . estradiol (ESTRACE) 0.5 MG tablet Take 0.5 mg by mouth daily.  . fluticasone (FLONASE) 50 MCG/ACT nasal spray Place 1 spray into both nostrils daily.  Marland Kitchen loperamide (IMODIUM) 2 MG capsule Take 2 capsules (4 mg total) by mouth every 6 (six) hours as needed for diarrhea or loose stools.  Marland Kitchen loratadine (CLARITIN) 10 MG tablet Take 10 mg by mouth daily.  . Misc Natural Products (HEALTHY LIVER PO) Take 2 tablets by mouth daily.  . Probiotic Product (PROBIOTIC DAILY PO) Take 1 capsule by mouth 2 (two) times daily.  . [DISCONTINUED] dicyclomine (BENTYL) 20 MG tablet Take 20 mg by mouth every 6 (six) hours as needed for spasms.  Marland Kitchen dicyclomine (BENTYL) 20 MG tablet Take 1 tablet (20 mg total) by mouth every 6 (six) hours as needed for spasms.   No facility-administered encounter medications on file as of 04/12/2021.   Allergies  Allergen Reactions  . Codeine Nausea And Vomiting  . Morphine Nausea And Vomiting  . Promethazine     Other reaction(s): Muscle Pain Muscle twitching  .  Meperidine Nausea And Vomiting  . Sulfadiazine Rash   Patient Active Problem List   Diagnosis Date Noted  . Hypokalemia 09/18/2019  . PONV (postoperative nausea and vomiting) 09/17/2019  . Hypomagnesemia 09/17/2019  . IBS (irritable bowel syndrome)   . Diverticular disease   . Complication of anesthesia   . Atopic dermatitis, unspecified 09/16/2019  . Diverticulitis large intestine 09/16/2019  . Diverticulitis s/p robotic rectosigmoid resection 09/16/2019 12/25/2018  . Allergic rhinitis due to other  allergen 05/07/2013   Social History   Socioeconomic History  . Marital status: Married    Spouse name: Not on file  . Number of children: 2  . Years of education: Not on file  . Highest education level: Not on file  Occupational History  . Occupation: Tour manager  Tobacco Use  . Smoking status: Never Smoker  . Smokeless tobacco: Never Used  Vaping Use  . Vaping Use: Never used  Substance and Sexual Activity  . Alcohol use: Yes    Comment: Wine- 1 glass nightly  . Drug use: No  . Sexual activity: Not on file  Other Topics Concern  . Not on file  Social History Narrative  . Not on file   Social Determinants of Health   Financial Resource Strain: Not on file  Food Insecurity: Not on file  Transportation Needs: Not on file  Physical Activity: Not on file  Stress: Not on file  Social Connections: Not on file  Intimate Partner Violence: Not on file    Ms. Zechman's family history includes Gallbladder disease in her sister.      Objective:    Vitals:   04/12/21 1506  BP: 120/70  Pulse: 85    Physical Exam Well-developed well-nourished white female in no acute distress.  Height, Weight, 135 BMI 25.5  HEENT; nontraumatic normocephalic, EOMI, PE R LA, sclera anicteric. Oropharynx; not examined today Neck; supple, no JVD Cardiovascular; regular rate and rhythm with S1-S2, no murmur rub or gallop Pulmonary; Clear bilaterally Abdomen; soft, nontender, nondistended, no palpable mass or hepatosplenomegaly, bowel sounds are active,  incisional port scars Rectal; not done today Skin; benign exam, no jaundice rash or appreciable lesions Extremities; no clubbing cyanosis or edema skin warm and dry Neuro/Psych; alert and oriented x4, grossly nonfocal mood and affect appropriate       Assessment & Plan:   #44 65 year old white female status post laparoscopic sigmoid colectomy September 2020 for recurrent diverticulitis who presents now with 8-10 discrete episodes of  intense sharp lower abdominal pain which has been transient and has occurred in the setting of sitting, bending over and lifting or raising the left leg.  Fortunately thus far these have resolved with lying down and stretching out.  I suspect the symptoms are adhesion related, rule out internal hernia, rule out small ventral hernia, rule out transient obstruction  #2 history of IBS, this is generally manifested with urgency and diarrhea, no active symptoms #3 history of adenomatous colon polyps-up-to-date with colonoscopy done in February 2020 and indicated for 5-year interval follow-up  #4 recent episode of lower abdominal discomfort/dull with tenderness lasting about 2 weeks and since resolved-question mild recurrent diverticulitis  Plan; patient will be scheduled for CT of the abdomen pelvis with contrast. Patient was advised that should she develop a more prolonged episode of the sharp severe pain that does not resolve she should proceed to the emergency room for further evaluation. Will refill dicyclomine 10 mg, 1 p.o. every 6 hours as needed for abdominal pain/discomfort.  She was advised to try this should she develop another of the more prolonged lower abdominal tenderness, discomfort. Also advised that it is possible that she still has diverticuli and could develop diverticulitis and should she develop another 1 of these episodes to call for advice and may warrant antibiotics. Further recommendations pending results of CT. Plan follow-up colonoscopy February 2025    Alfredia Ferguson PA-C 04/12/2021   Cc: No ref. provider found

## 2021-04-12 NOTE — Progress Notes (Signed)
Assessment noted 

## 2021-04-12 NOTE — Patient Instructions (Signed)
If you are age 64 or older, your body mass index should be between 23-30. Your Body mass index is 25.51 kg/m. If this is out of the aforementioned range listed, please consider follow up with your Primary Care Provider.  If you are age 36 or younger, your body mass index should be between 19-25. Your Body mass index is 25.51 kg/m. If this is out of the aformentioned range listed, please consider follow up with your Primary Care Provider.   You have been scheduled for a CT scan of the abdomen and pelvis at [facility] ([address]---this is in the same building as [employer]).   You are scheduled on 04/19/2021 at 2:30. You should arrive 15 minutes prior to your appointment time for registration. Please follow the written instructions below on the day of your exam:  WARNING: IF YOU ARE ALLERGIC TO IODINE/X-RAY DYE, PLEASE NOTIFY RADIOLOGY IMMEDIATELY AT [phone]! YOU WILL BE GIVEN A 13 HOUR PREMEDICATION PREP.  1) Do not eat or drink anything after 10:30 am (4 hours prior to your test) 2) You have been given 2 bottles of oral contrast to drink. The solution may taste better if refrigerated, but do NOT add ice or any other liquid to this solution. Shake well before drinking.    Drink 1 bottle of contrast @ 12:30 pm (2 hours prior to your exam)  Drink 1 bottle of contrast @ 1:30 pm (1 hour prior to your exam)  You may take any medications as prescribed with a small amount of water, if necessary. If you take any of the following medications: METFORMIN, GLUCOPHAGE, GLUCOVANCE, AVANDAMET, RIOMET, FORTAMET, [place] MET, JANUMET, GLUMETZA or METAGLIP, you MAY be asked to HOLD this medication 48 hours AFTER the exam.  The purpose of you drinking the oral contrast is to aid in the visualization of your intestinal tract. The contrast solution may cause some diarrhea. Depending on your individual set of symptoms, you may also receive an intravenous injection of x-ray  contrast/dye. Plan on being at [facility] for 30 minutes or longer, depending on the type of exam you are having performed.  This test typically takes 30-45 minutes to complete.  If you have any questions regarding your exam or if you need to reschedule, you may call the CT department at [phone] between the hours of 8:00 am and 5:00 pm, Monday-Friday.  _____________________________________________________________  Your provider has requested that you go to the basement level for lab work before leaving today. Press "B" on the elevator. The lab is located at the first door on the left as you exit the elevator.  Continue dicyclomine 20 mg 1 tablet every 6 hours as needed for abdominal pain/spasms.  Follow up pending the results of your CT or as needed.  Thank you for entrusting me with your care and choosing [facility].  Amy Esterwood, PA-C

## 2021-04-17 DIAGNOSIS — J3089 Other allergic rhinitis: Secondary | ICD-10-CM | POA: Diagnosis not present

## 2021-04-17 DIAGNOSIS — J301 Allergic rhinitis due to pollen: Secondary | ICD-10-CM | POA: Diagnosis not present

## 2021-04-19 ENCOUNTER — Other Ambulatory Visit: Payer: Self-pay

## 2021-04-19 ENCOUNTER — Ambulatory Visit (INDEPENDENT_AMBULATORY_CARE_PROVIDER_SITE_OTHER)
Admission: RE | Admit: 2021-04-19 | Discharge: 2021-04-19 | Disposition: A | Discharge status: Home / Self Care | Payer: Federal, State, Local not specified - PPO | Source: Ambulatory Visit | Attending: Physician Assistant | Admitting: Physician Assistant

## 2021-04-19 DIAGNOSIS — K589 Irritable bowel syndrome without diarrhea: Secondary | ICD-10-CM | POA: Diagnosis not present

## 2021-04-19 DIAGNOSIS — R109 Unspecified abdominal pain: Secondary | ICD-10-CM | POA: Diagnosis not present

## 2021-04-19 DIAGNOSIS — R1030 Lower abdominal pain, unspecified: Secondary | ICD-10-CM | POA: Diagnosis not present

## 2021-04-19 MED ORDER — IOHEXOL 300 MG/ML  SOLN
80.0000 mL | Freq: Once | INTRAMUSCULAR | Status: AC | PRN
  Administered 2021-04-19: 80 mL via INTRAVENOUS

## 2021-04-20 ENCOUNTER — Telehealth: Payer: Self-pay | Admitting: Physician Assistant

## 2021-04-20 NOTE — Telephone Encounter (Signed)
Patient called in. Had CT done 5/4. States can call her with the results at [phone] until 5:30 on 04/21/21.

## 2021-04-22 DIAGNOSIS — J301 Allergic rhinitis due to pollen: Secondary | ICD-10-CM | POA: Diagnosis not present

## 2021-04-22 DIAGNOSIS — J3089 Other allergic rhinitis: Secondary | ICD-10-CM | POA: Diagnosis not present

## 2021-04-24 ENCOUNTER — Other Ambulatory Visit: Payer: Self-pay | Admitting: Physician Assistant

## 2021-05-23 DIAGNOSIS — J3089 Other allergic rhinitis: Secondary | ICD-10-CM | POA: Diagnosis not present

## 2021-05-23 DIAGNOSIS — E785 Hyperlipidemia, unspecified: Secondary | ICD-10-CM | POA: Diagnosis not present

## 2021-05-23 DIAGNOSIS — R7303 Prediabetes: Secondary | ICD-10-CM | POA: Diagnosis not present

## 2021-05-23 DIAGNOSIS — J301 Allergic rhinitis due to pollen: Secondary | ICD-10-CM | POA: Diagnosis not present

## 2021-05-29 DIAGNOSIS — K5790 Diverticulosis of intestine, part unspecified, without perforation or abscess without bleeding: Secondary | ICD-10-CM | POA: Diagnosis not present

## 2021-05-29 DIAGNOSIS — R7309 Other abnormal glucose: Secondary | ICD-10-CM | POA: Diagnosis not present

## 2021-05-29 DIAGNOSIS — E782 Mixed hyperlipidemia: Secondary | ICD-10-CM | POA: Diagnosis not present

## 2021-05-29 DIAGNOSIS — K589 Irritable bowel syndrome without diarrhea: Secondary | ICD-10-CM | POA: Diagnosis not present

## 2021-05-30 DIAGNOSIS — J3089 Other allergic rhinitis: Secondary | ICD-10-CM | POA: Diagnosis not present

## 2021-05-30 DIAGNOSIS — J301 Allergic rhinitis due to pollen: Secondary | ICD-10-CM | POA: Diagnosis not present

## 2021-06-05 DIAGNOSIS — J301 Allergic rhinitis due to pollen: Secondary | ICD-10-CM | POA: Diagnosis not present

## 2021-06-05 DIAGNOSIS — J3089 Other allergic rhinitis: Secondary | ICD-10-CM | POA: Diagnosis not present

## 2021-06-12 DIAGNOSIS — J301 Allergic rhinitis due to pollen: Secondary | ICD-10-CM | POA: Diagnosis not present

## 2021-06-12 DIAGNOSIS — J3089 Other allergic rhinitis: Secondary | ICD-10-CM | POA: Diagnosis not present

## 2021-06-27 DIAGNOSIS — J3089 Other allergic rhinitis: Secondary | ICD-10-CM | POA: Diagnosis not present

## 2021-06-27 DIAGNOSIS — J301 Allergic rhinitis due to pollen: Secondary | ICD-10-CM | POA: Diagnosis not present

## 2021-07-23 DIAGNOSIS — Z20822 Contact with and (suspected) exposure to covid-19: Secondary | ICD-10-CM | POA: Diagnosis not present

## 2021-07-28 DIAGNOSIS — J069 Acute upper respiratory infection, unspecified: Secondary | ICD-10-CM | POA: Diagnosis not present

## 2021-07-28 DIAGNOSIS — Z79890 Hormone replacement therapy (postmenopausal): Secondary | ICD-10-CM | POA: Diagnosis not present

## 2021-07-28 DIAGNOSIS — Z90710 Acquired absence of both cervix and uterus: Secondary | ICD-10-CM | POA: Diagnosis not present

## 2021-07-28 DIAGNOSIS — J40 Bronchitis, not specified as acute or chronic: Secondary | ICD-10-CM | POA: Diagnosis not present

## 2021-07-31 DIAGNOSIS — Z1231 Encounter for screening mammogram for malignant neoplasm of breast: Secondary | ICD-10-CM | POA: Diagnosis not present

## 2021-07-31 DIAGNOSIS — J3089 Other allergic rhinitis: Secondary | ICD-10-CM | POA: Diagnosis not present

## 2021-07-31 DIAGNOSIS — J301 Allergic rhinitis due to pollen: Secondary | ICD-10-CM | POA: Diagnosis not present

## 2021-08-07 DIAGNOSIS — J3089 Other allergic rhinitis: Secondary | ICD-10-CM | POA: Diagnosis not present

## 2021-08-07 DIAGNOSIS — J301 Allergic rhinitis due to pollen: Secondary | ICD-10-CM | POA: Diagnosis not present

## 2021-08-14 DIAGNOSIS — N6001 Solitary cyst of right breast: Secondary | ICD-10-CM | POA: Diagnosis not present

## 2021-08-14 DIAGNOSIS — R922 Inconclusive mammogram: Secondary | ICD-10-CM | POA: Diagnosis not present

## 2021-09-04 DIAGNOSIS — J301 Allergic rhinitis due to pollen: Secondary | ICD-10-CM | POA: Diagnosis not present

## 2021-09-04 DIAGNOSIS — J3089 Other allergic rhinitis: Secondary | ICD-10-CM | POA: Diagnosis not present

## 2021-09-11 DIAGNOSIS — M7541 Impingement syndrome of right shoulder: Secondary | ICD-10-CM | POA: Diagnosis not present

## 2021-10-02 DIAGNOSIS — J301 Allergic rhinitis due to pollen: Secondary | ICD-10-CM | POA: Diagnosis not present

## 2021-10-02 DIAGNOSIS — J3089 Other allergic rhinitis: Secondary | ICD-10-CM | POA: Diagnosis not present

## 2021-10-30 DIAGNOSIS — J301 Allergic rhinitis due to pollen: Secondary | ICD-10-CM | POA: Diagnosis not present

## 2021-10-30 DIAGNOSIS — J3089 Other allergic rhinitis: Secondary | ICD-10-CM | POA: Diagnosis not present

## 2021-11-01 ENCOUNTER — Encounter: Payer: Self-pay | Admitting: Internal Medicine

## 2021-11-01 ENCOUNTER — Ambulatory Visit: Payer: Federal, State, Local not specified - PPO | Admitting: Internal Medicine

## 2021-11-01 VITALS — BP 102/60 | HR 76 | Ht 61.0 in | Wt 129.5 lb

## 2021-11-01 DIAGNOSIS — R195 Other fecal abnormalities: Secondary | ICD-10-CM

## 2021-11-01 DIAGNOSIS — K589 Irritable bowel syndrome without diarrhea: Secondary | ICD-10-CM | POA: Diagnosis not present

## 2021-11-01 DIAGNOSIS — R159 Full incontinence of feces: Secondary | ICD-10-CM | POA: Diagnosis not present

## 2021-11-01 MED ORDER — DICYCLOMINE HCL 20 MG PO TABS: 20.0000 mg | ORAL_TABLET | Freq: Four times a day (QID) | ORAL | 3 refills | Status: DC | PRN

## 2021-11-01 NOTE — Patient Instructions (Signed)
If you are age 64 or older, your body mass index should be between 23-30. Your Body mass index is 24.47 kg/m. If this is out of the aforementioned range listed, please consider follow up with your Primary Care Provider.  If you are age 79 or younger, your body mass index should be between 19-25. Your Body mass index is 24.47 kg/m. If this is out of the aformentioned range listed, please consider follow up with your Primary Care Provider.   ________________________________________________________  The [facility] providers would like to encourage you to use [facility] to communicate with providers for non-urgent requests or questions.  Due to long hold times on the telephone, sending your provider a message by [facility] may be a faster and more efficient way to get a response.  Please allow 48 business hours for a response.  Please remember that this is for non-urgent requests.  _______________________________________________________  We have sent the following medications to your pharmacy for you to pick up at your convenience:  Bentyl  Increase Metamucil to 2 tablespoons daily  Wear protective undergarments  Please follow up on _________________________

## 2021-11-01 NOTE — Progress Notes (Signed)
HISTORY OF PRESENT ILLNESS:  Stephanie Hoover is a 65 y.o. female with a history of complicated diverticular disease status post sigmoid colectomy September 2020.  Patient presents today with complaints of sporadic diarrhea and fecal incontinence.  She was last seen in this office April 12, 2021 by the GI physician assistant regarding sharp intermittent lower abdominal pain.  Patient does have a history of irritable bowel.  She also has a history of adenomatous colon polyps with last colonoscopy February 2020.  For her complaints of pain she underwent CT scan of the abdomen and pelvis with contrast Apr 19, 2021.  There were no acute findings.  She is noted to have diverticulosis and cholelithiasis.  Patient tells me that over the past 6 months she will have instances approximately once per week where she will develop severe urgency and the need to go to the bathroom.  This is often associated with episodes of incontinence.  This has impacted the quality of her life.  She typically has daily bowel movements.  She describes 80% as formed.  When she does have incontinence spells, her bowels are loose.  She takes a teaspoon of Metamucil at night.  She has been trying dicyclomine once each morning prior to work.  She feels that this helps.  She does use sugar substitutes and drinks sugar-free beverages.  She has been using protective undergarments.  Sometimes symptoms are exacerbated by meals.  No nocturnal issues.  She has rarely used Imodium.  Review of blood work from May 23, 2021 shows normal comprehensive metabolic panel.  She also reports an intermittent twisting or ball-like sensation in the lower abdomen and left abdomen.  She does report that she has been under stress over the past 6 months.  She is lost about 6 pounds.  REVIEW OF SYSTEMS:  All non-GI ROS negative unless otherwise stated in the HPI except for sinus and allergy trouble, anxiety, fatigue, headaches, muscle cramps, night sweats, sleeping  problems  Past Medical History:  Diagnosis Date   Allergy    Arthritis    Cholelithiasis    Complication of anesthesia    Diverticular disease    Diverticulitis    History of kidney stones    Hyperlipidemia    IBS (irritable bowel syndrome)    Kidney stone    Pilonidal cyst    PONV (postoperative nausea and vomiting)     Past Surgical History:  Procedure Laterality Date   ABDOMINAL HYSTERECTOMY     total   APPENDECTOMY     FRACTURE SURGERY Right    wrist , clavical   PARTIAL COLECTOMY  2020    Social History Stephanie Hoover  reports that she has never smoked. She has never used smokeless tobacco. She reports current alcohol use. She reports that she does not use drugs.  family history includes Gallbladder disease in her sister.  Allergies  Allergen Reactions   Codeine Nausea And Vomiting   Morphine Nausea And Vomiting   Promethazine     Other reaction(s): Muscle Pain Muscle twitching   Meperidine Nausea And Vomiting   Sulfadiazine Rash       PHYSICAL EXAMINATION: Vital signs: BP 102/60   Pulse 76   Ht 5\' 1"  (1.549 m)   Wt 129 lb 8 oz (58.7 kg)   BMI 24.47 kg/m   Constitutional: generally well-appearing, no acute distress Psychiatric: alert and oriented x3, cooperative Eyes: extraocular movements intact, anicteric, conjunctiva pink Mouth: Mask Neck: supple no lymphadenopathy Cardiovascular: heart regular rate and rhythm,  no murmur Lungs: clear to auscultation bilaterally Abdomen: soft, nontender, nondistended, no obvious ascites, no peritoneal signs, normal bowel sounds, no organomegaly Rectal: Omitted Extremities: no clubbing, cyanosis, or lower extremity edema bilaterally Skin: no lesions on visible extremities Neuro: No focal deficits.  Cranial nerves intact  ASSESSMENT:  1.  Intermittent problems with urgency with somewhat loose stools and fecal incontinence. 2.  History of complicated diverticular disease status post sigmoid colectomy September  2020 3.  History of adenomatous colon polyps.  Last colonoscopy February 2020 4.  Reported history of irritable bowel syndrome   PLAN:  1.  Increase Metamucil to 2 tablespoons daily 2.  Refill dicyclomine 20 mg.  Medication risks reviewed.  Okay to take dicyclomine once or twice daily as needed 3.  Avoid all sugar substitutes and sugar-free drinks as this may exacerbate loose stools 4.  Continue to use protective undergarments 5.  Office follow-up 6 weeks. 6.  Surveillance colonoscopy around February 2025 A total time of 30 minutes was spent preparing to see the patient, reviewing laboratories and x-rays, obtaining comprehensive history, performing medically appropriate physical examination, counseling the patient regarding the above listed issues, ordering medications, arranging follow-up, and documenting clinical information in the health record

## 2021-11-03 ENCOUNTER — Telehealth: Payer: Self-pay | Admitting: Internal Medicine

## 2021-11-03 NOTE — Telephone Encounter (Signed)
Left vm in pt's phone advising to come to the office to fill out release of information and FMLA patient intake forms.

## 2021-11-04 DIAGNOSIS — J3089 Other allergic rhinitis: Secondary | ICD-10-CM | POA: Diagnosis not present

## 2021-11-04 DIAGNOSIS — J301 Allergic rhinitis due to pollen: Secondary | ICD-10-CM | POA: Diagnosis not present

## 2021-11-07 NOTE — Telephone Encounter (Signed)
Pt returned my call. She [place] far away (closed to [employer]) and cannot come to the office to fill out forms. Forms will be mailed. Pt aware that she can return forms via Mychart, mail or fax and if more information is needed she may need to come to the office to complete forms.

## 2021-11-13 ENCOUNTER — Other Ambulatory Visit: Payer: Self-pay | Admitting: Internal Medicine

## 2021-11-16 NOTE — Telephone Encounter (Signed)
Pt came to the office to fill out intake and release of information forms as she did not receive them in the mail. All forms included FMLA were given to [facility] in a yellow folder.

## 2021-12-05 DIAGNOSIS — J301 Allergic rhinitis due to pollen: Secondary | ICD-10-CM | POA: Diagnosis not present

## 2021-12-05 DIAGNOSIS — J3089 Other allergic rhinitis: Secondary | ICD-10-CM | POA: Diagnosis not present

## 2021-12-12 DIAGNOSIS — J3089 Other allergic rhinitis: Secondary | ICD-10-CM | POA: Diagnosis not present

## 2021-12-12 DIAGNOSIS — J301 Allergic rhinitis due to pollen: Secondary | ICD-10-CM | POA: Diagnosis not present

## 2021-12-12 NOTE — Telephone Encounter (Signed)
Spoke with pt to let her know that Dr. Henrene Pastor will not be able to fill out FMLA for IBS. Pt stated that she will speak with him at her upcoming appt. In January. Forms will be at the front desk until pt's ov on 01/04/22.

## 2021-12-12 NOTE — Telephone Encounter (Signed)
noted 

## 2021-12-15 ENCOUNTER — Other Ambulatory Visit: Payer: Self-pay | Admitting: Internal Medicine

## 2021-12-15 DIAGNOSIS — J301 Allergic rhinitis due to pollen: Secondary | ICD-10-CM | POA: Diagnosis not present

## 2021-12-15 DIAGNOSIS — J3089 Other allergic rhinitis: Secondary | ICD-10-CM | POA: Diagnosis not present

## 2021-12-22 DIAGNOSIS — J301 Allergic rhinitis due to pollen: Secondary | ICD-10-CM | POA: Diagnosis not present

## 2021-12-22 DIAGNOSIS — J3089 Other allergic rhinitis: Secondary | ICD-10-CM | POA: Diagnosis not present

## 2021-12-26 DIAGNOSIS — J3089 Other allergic rhinitis: Secondary | ICD-10-CM | POA: Diagnosis not present

## 2021-12-26 DIAGNOSIS — J301 Allergic rhinitis due to pollen: Secondary | ICD-10-CM | POA: Diagnosis not present

## 2022-01-04 ENCOUNTER — Ambulatory Visit: Payer: Federal, State, Local not specified - PPO | Admitting: Internal Medicine

## 2022-01-05 ENCOUNTER — Other Ambulatory Visit: Payer: Self-pay | Admitting: Internal Medicine

## 2022-01-07 ENCOUNTER — Other Ambulatory Visit: Payer: Self-pay | Admitting: Internal Medicine

## 2022-01-09 DIAGNOSIS — J3089 Other allergic rhinitis: Secondary | ICD-10-CM | POA: Diagnosis not present

## 2022-01-09 DIAGNOSIS — J301 Allergic rhinitis due to pollen: Secondary | ICD-10-CM | POA: Diagnosis not present

## 2022-02-05 DIAGNOSIS — R5383 Other fatigue: Secondary | ICD-10-CM | POA: Diagnosis not present

## 2022-02-05 DIAGNOSIS — R051 Acute cough: Secondary | ICD-10-CM | POA: Diagnosis not present

## 2022-02-05 DIAGNOSIS — R0981 Nasal congestion: Secondary | ICD-10-CM | POA: Diagnosis not present

## 2022-02-05 DIAGNOSIS — R52 Pain, unspecified: Secondary | ICD-10-CM | POA: Diagnosis not present

## 2022-02-12 DIAGNOSIS — J3089 Other allergic rhinitis: Secondary | ICD-10-CM | POA: Diagnosis not present

## 2022-02-12 DIAGNOSIS — J301 Allergic rhinitis due to pollen: Secondary | ICD-10-CM | POA: Diagnosis not present

## 2022-02-23 DIAGNOSIS — J3089 Other allergic rhinitis: Secondary | ICD-10-CM | POA: Diagnosis not present

## 2022-02-23 DIAGNOSIS — J301 Allergic rhinitis due to pollen: Secondary | ICD-10-CM | POA: Diagnosis not present

## 2022-02-26 DIAGNOSIS — J301 Allergic rhinitis due to pollen: Secondary | ICD-10-CM | POA: Diagnosis not present

## 2022-02-26 DIAGNOSIS — J3089 Other allergic rhinitis: Secondary | ICD-10-CM | POA: Diagnosis not present

## 2022-03-19 DIAGNOSIS — J3089 Other allergic rhinitis: Secondary | ICD-10-CM | POA: Diagnosis not present

## 2022-03-19 DIAGNOSIS — J301 Allergic rhinitis due to pollen: Secondary | ICD-10-CM | POA: Diagnosis not present

## 2022-03-24 DIAGNOSIS — J3089 Other allergic rhinitis: Secondary | ICD-10-CM | POA: Diagnosis not present

## 2022-03-24 DIAGNOSIS — J301 Allergic rhinitis due to pollen: Secondary | ICD-10-CM | POA: Diagnosis not present

## 2022-04-16 DIAGNOSIS — J3089 Other allergic rhinitis: Secondary | ICD-10-CM | POA: Diagnosis not present

## 2022-04-16 DIAGNOSIS — J301 Allergic rhinitis due to pollen: Secondary | ICD-10-CM | POA: Diagnosis not present

## 2022-04-23 DIAGNOSIS — Z79890 Hormone replacement therapy (postmenopausal): Secondary | ICD-10-CM | POA: Diagnosis not present

## 2022-04-23 DIAGNOSIS — G44209 Tension-type headache, unspecified, not intractable: Secondary | ICD-10-CM | POA: Diagnosis not present

## 2022-04-23 DIAGNOSIS — H6983 Other specified disorders of Eustachian tube, bilateral: Secondary | ICD-10-CM | POA: Diagnosis not present

## 2022-05-04 DIAGNOSIS — H6983 Other specified disorders of Eustachian tube, bilateral: Secondary | ICD-10-CM | POA: Diagnosis not present

## 2022-05-21 DIAGNOSIS — G43109 Migraine with aura, not intractable, without status migrainosus: Secondary | ICD-10-CM | POA: Diagnosis not present

## 2022-05-21 DIAGNOSIS — H35363 Drusen (degenerative) of macula, bilateral: Secondary | ICD-10-CM | POA: Diagnosis not present

## 2022-05-22 DIAGNOSIS — J301 Allergic rhinitis due to pollen: Secondary | ICD-10-CM | POA: Diagnosis not present

## 2022-05-22 DIAGNOSIS — J3089 Other allergic rhinitis: Secondary | ICD-10-CM | POA: Diagnosis not present

## 2022-05-28 DIAGNOSIS — J3089 Other allergic rhinitis: Secondary | ICD-10-CM | POA: Diagnosis not present

## 2022-05-28 DIAGNOSIS — J301 Allergic rhinitis due to pollen: Secondary | ICD-10-CM | POA: Diagnosis not present

## 2022-06-04 DIAGNOSIS — J301 Allergic rhinitis due to pollen: Secondary | ICD-10-CM | POA: Diagnosis not present

## 2022-06-04 DIAGNOSIS — J3089 Other allergic rhinitis: Secondary | ICD-10-CM | POA: Diagnosis not present

## 2022-06-11 DIAGNOSIS — J301 Allergic rhinitis due to pollen: Secondary | ICD-10-CM | POA: Diagnosis not present

## 2022-06-11 DIAGNOSIS — J3089 Other allergic rhinitis: Secondary | ICD-10-CM | POA: Diagnosis not present

## 2022-07-02 DIAGNOSIS — J3089 Other allergic rhinitis: Secondary | ICD-10-CM | POA: Diagnosis not present

## 2022-07-02 DIAGNOSIS — J301 Allergic rhinitis due to pollen: Secondary | ICD-10-CM | POA: Diagnosis not present

## 2022-07-31 DIAGNOSIS — J3089 Other allergic rhinitis: Secondary | ICD-10-CM | POA: Diagnosis not present

## 2022-07-31 DIAGNOSIS — J301 Allergic rhinitis due to pollen: Secondary | ICD-10-CM | POA: Diagnosis not present

## 2022-08-06 DIAGNOSIS — D492 Neoplasm of unspecified behavior of bone, soft tissue, and skin: Secondary | ICD-10-CM | POA: Diagnosis not present

## 2022-08-06 DIAGNOSIS — L570 Actinic keratosis: Secondary | ICD-10-CM | POA: Diagnosis not present

## 2022-08-06 DIAGNOSIS — D2261 Melanocytic nevi of right upper limb, including shoulder: Secondary | ICD-10-CM | POA: Diagnosis not present

## 2022-08-06 DIAGNOSIS — L448 Other specified papulosquamous disorders: Secondary | ICD-10-CM | POA: Diagnosis not present

## 2022-08-06 DIAGNOSIS — L821 Other seborrheic keratosis: Secondary | ICD-10-CM | POA: Diagnosis not present

## 2022-08-06 DIAGNOSIS — L538 Other specified erythematous conditions: Secondary | ICD-10-CM | POA: Diagnosis not present

## 2022-08-06 DIAGNOSIS — D225 Melanocytic nevi of trunk: Secondary | ICD-10-CM | POA: Diagnosis not present

## 2022-08-06 DIAGNOSIS — L820 Inflamed seborrheic keratosis: Secondary | ICD-10-CM | POA: Diagnosis not present

## 2022-08-06 DIAGNOSIS — L298 Other pruritus: Secondary | ICD-10-CM | POA: Diagnosis not present

## 2022-08-06 DIAGNOSIS — R208 Other disturbances of skin sensation: Secondary | ICD-10-CM | POA: Diagnosis not present

## 2022-08-13 DIAGNOSIS — Z20822 Contact with and (suspected) exposure to covid-19: Secondary | ICD-10-CM | POA: Diagnosis not present

## 2022-08-13 DIAGNOSIS — J029 Acute pharyngitis, unspecified: Secondary | ICD-10-CM | POA: Diagnosis not present

## 2022-08-27 DIAGNOSIS — J3089 Other allergic rhinitis: Secondary | ICD-10-CM | POA: Diagnosis not present

## 2022-08-27 DIAGNOSIS — J301 Allergic rhinitis due to pollen: Secondary | ICD-10-CM | POA: Diagnosis not present

## 2022-09-18 DIAGNOSIS — N951 Menopausal and female climacteric states: Secondary | ICD-10-CM | POA: Diagnosis not present

## 2022-09-18 DIAGNOSIS — Z01419 Encounter for gynecological examination (general) (routine) without abnormal findings: Secondary | ICD-10-CM | POA: Diagnosis not present

## 2022-09-24 DIAGNOSIS — J3089 Other allergic rhinitis: Secondary | ICD-10-CM | POA: Diagnosis not present

## 2022-09-24 DIAGNOSIS — J301 Allergic rhinitis due to pollen: Secondary | ICD-10-CM | POA: Diagnosis not present

## 2022-10-10 ENCOUNTER — Telehealth: Payer: Self-pay | Admitting: Internal Medicine

## 2022-10-10 NOTE — Telephone Encounter (Signed)
Inbound call from pt states she is having lower abdominal pain, diarrhea and loss of appetite, wanted to make an appt soon but Dr. Blanch Media next available is December. Please advise.

## 2022-10-10 NOTE — Telephone Encounter (Signed)
Left message for pt to call back  °

## 2022-10-10 NOTE — Telephone Encounter (Signed)
Pt scheduled to see Vicie Mutters PA 10/12/22 at 1:30pm. Pt aware of appt.

## 2022-10-11 NOTE — Progress Notes (Signed)
10/12/2022 Stephanie Hoover [id number] October 17, 1956  Referring provider: No ref. provider found Primary GI doctor: Dr. Henrene Pastor  ASSESSMENT AND PLAN:   Left lower quadrant abdominal pain with history of complicated diverticulosis with partial colectomy Get Sed rate, CBC, CMET can do trial of IBGARD daily. Add on citracel/benefiber FODMAP, and lifestyle changes discussed Consider xifaxin trial pending results Some decrease BS and no BM with decreased gas since Tuesday, will get KUB today, if negative for obstruction will proceed with CT AB and pelvis to rule out diverticulitis.  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   Screen for colon cancer Due 2025  History of Present Illness:  66 y.o. female  with a past medical history of hypokalemia, hypomagnesia, history of adenomatous polyps IBS, complicated diverticular disease status post sigmoid colectomy 09/16/2019 and others listed below, returns to clinic today for evaluation of lower AB pain. 01/2019 colonoscopy recall 01/2024 Negative C. difficile and GI pathogen panel 2020 03/20/2021 CT abdomen pelvis with contrast without acute findings, noted to have diverticulosis and cholelithiasis. 11/01/2021 office visit with Dr. Henrene Pastor sporadic diarrhea and fecal incontinence-started on Metamucil, dicyclomine, avoid sugars, follow-up 6 weeks.  Patient called the office yesterday complaining of lower abdominal pain, diarrhea and loss of appetite, presents today to discuss. She states she is having alternating diarrhea/constipation with 1 month of lower AB pain.  She states yesterday at work she ate  something this started to have lower AB pain burning/cramping pain, tried to go to the bathroom but could not go She woke up Tuesday night, woke her up at 1 AM, went several times that night, took dicyclomine and has not had BM since that time. Normally has 3-4 loose BM's a day. Dicyclomine can sometime cause constipation.  She has urgency with fecal incontinence, has been more frequent, will happen 3 x a week lately and prior just occasional.  She has had a month of lower AB pain.  She takes fiber nightly. Occ formed stools, never hard stools.  When she has diarrhea she will take dicyclomine at night and one at work.  She is passing little amount of gas in the last week but has some.  She has been having AB bloating, less since the flu, not eating as much.  Denies nausea, vomiting, GERD. Some burping.  She has had black stool with pepto, none since being off last 2 weeks.  Has had decreased appetite since flu last week. Has lost about 5 lbs in last 2 weeks but according to our scale, no weight loss.   Wt Readings from Last 3 Encounters:  10/12/22 131 lb (59.4 kg)  11/01/21 129 lb 8 oz (58.7 kg)  04/12/21 135 lb (61.2 kg)     She  reports that she has never smoked. She has never used smokeless tobacco. She reports current alcohol use. She reports that she does not use drugs. Her family history includes Gallbladder disease in her sister.   Current Medications:   Current Outpatient Medications (Endocrine & Metabolic):    estradiol (ESTRACE) 0.5 MG tablet, Take 0.5 mg by mouth daily.  Current Outpatient Medications (Cardiovascular):    atorvastatin (LIPITOR) 20 MG tablet, Take 1 tablet by mouth  daily.  Current Outpatient Medications (Respiratory):    fluticasone (FLONASE) 50 MCG/ACT nasal spray, Place 1 spray into both nostrils daily.   loratadine (CLARITIN) 10 MG tablet, Take 10 mg by mouth daily.    Current Outpatient Medications (Other):    dicyclomine (BENTYL) 20 MG tablet,  TAKE 1 TABLET (20 MG TOTAL) BY MOUTH EVERY 6 (SIX) HOURS AS NEEDED FOR SPASMS.   estradiol (ESTRACE) 0.1 MG/GM vaginal cream, Place 0.5 Applicatorfuls vaginally 2 (two) times a week.    hyoscyamine (LEVSIN) 0.125 MG/5ML ELIX, Take 0.125 mg by mouth.   loperamide (IMODIUM) 2 MG capsule, Take 2 capsules (4 mg total) by mouth every 6 (six) hours as needed for diarrhea or loose stools.   Misc Natural Products (HEALTHY LIVER PO), Take 2 tablets by mouth daily.   Probiotic Product (PROBIOTIC DAILY PO), Take 1 capsule by mouth 2 (two) times daily.  Surgical History:  She  has a past surgical history that includes Appendectomy; Abdominal hysterectomy; Fracture surgery (Right); and Partial colectomy (2020).  Current Medications, Allergies, Past Medical History, Past Surgical History, Family History and Social History were reviewed in [employer] record.  Physical Exam: BP 110/68   Pulse 87   Ht 5' (1.524 m)   Wt 131 lb (59.4 kg)   SpO2 97%   BMI 25.58 kg/m  General:   Pleasant, well developed female in no acute distress Heart : Regular rate and rhythm; no murmurs Pulm: Clear anteriorly; no wheezing Abdomen:  Soft, Obese AB, decreased lower AB sounds with active RUQ, moderate tenderness in the LLQ. With guarding and Without rebound, No organomegaly appreciated. Rectal: Not evaluated Extremities:  without  edema. Neurologic:  Alert and  oriented x4;  No focal deficits.  Psych:  Cooperative. Normal mood and affect.   Vladimir Crofts, PA-C 10/12/22

## 2022-10-12 ENCOUNTER — Other Ambulatory Visit (INDEPENDENT_AMBULATORY_CARE_PROVIDER_SITE_OTHER): Payer: Federal, State, Local not specified - PPO

## 2022-10-12 ENCOUNTER — Encounter: Payer: Self-pay | Admitting: Physician Assistant

## 2022-10-12 ENCOUNTER — Ambulatory Visit (INDEPENDENT_AMBULATORY_CARE_PROVIDER_SITE_OTHER)
Admission: RE | Admit: 2022-10-12 | Discharge: 2022-10-12 | Disposition: A | Discharge status: Home / Self Care | Payer: Federal, State, Local not specified - PPO | Source: Ambulatory Visit | Attending: Physician Assistant | Admitting: Physician Assistant

## 2022-10-12 ENCOUNTER — Ambulatory Visit: Payer: Federal, State, Local not specified - PPO | Admitting: Physician Assistant

## 2022-10-12 VITALS — BP 110/68 | HR 87 | Ht 60.0 in | Wt 131.0 lb

## 2022-10-12 DIAGNOSIS — K580 Irritable bowel syndrome with diarrhea: Secondary | ICD-10-CM

## 2022-10-12 DIAGNOSIS — R1032 Left lower quadrant pain: Secondary | ICD-10-CM

## 2022-10-12 DIAGNOSIS — R159 Full incontinence of feces: Secondary | ICD-10-CM | POA: Diagnosis not present

## 2022-10-12 DIAGNOSIS — Z1211 Encounter for screening for malignant neoplasm of colon: Secondary | ICD-10-CM | POA: Diagnosis not present

## 2022-10-12 DIAGNOSIS — K5900 Constipation, unspecified: Secondary | ICD-10-CM | POA: Diagnosis not present

## 2022-10-12 DIAGNOSIS — R1030 Lower abdominal pain, unspecified: Secondary | ICD-10-CM | POA: Diagnosis not present

## 2022-10-12 LAB — CBC WITH DIFFERENTIAL/PLATELET
Basophils Absolute: 0 10*3/uL (ref 0.0–0.1)
Basophils Relative: 0.7 % (ref 0.0–3.0)
Eosinophils Absolute: 0.5 10*3/uL (ref 0.0–0.7)
Eosinophils Relative: 9 % — ABNORMAL HIGH (ref 0.0–5.0)
HCT: 38.4 % (ref 36.0–46.0)
Hemoglobin: 13.1 g/dL (ref 12.0–15.0)
Lymphocytes Relative: 29.3 % (ref 12.0–46.0)
Lymphs Abs: 1.6 10*3/uL (ref 0.7–4.0)
MCHC: 34.1 g/dL (ref 30.0–36.0)
MCV: 93.2 fl (ref 78.0–100.0)
Monocytes Absolute: 0.5 10*3/uL (ref 0.1–1.0)
Monocytes Relative: 8.5 % (ref 3.0–12.0)
Neutro Abs: 2.9 10*3/uL (ref 1.4–7.7)
Neutrophils Relative %: 52.5 % (ref 43.0–77.0)
Platelets: 258 10*3/uL (ref 150.0–400.0)
RBC: 4.12 Mil/uL (ref 3.87–5.11)
RDW: 13 % (ref 11.5–15.5)
WBC: 5.5 10*3/uL (ref 4.0–10.5)

## 2022-10-12 LAB — COMPREHENSIVE METABOLIC PANEL
ALT: 44 U/L — ABNORMAL HIGH (ref 0–35)
AST: 33 U/L (ref 0–37)
Albumin: 4.3 g/dL (ref 3.5–5.2)
Alkaline Phosphatase: 59 U/L (ref 39–117)
BUN: 11 mg/dL (ref 6–23)
CO2: 31 mEq/L (ref 19–32)
Calcium: 9 mg/dL (ref 8.4–10.5)
Chloride: 103 mEq/L (ref 96–112)
Creatinine, Ser: 0.79 mg/dL (ref 0.40–1.20)
GFR: 78.47 mL/min (ref >60.00)
Glucose, Bld: 93 mg/dL (ref 70–99)
Potassium: 3.5 mEq/L (ref 3.5–5.1)
Sodium: 140 mEq/L (ref 135–145)
Total Bilirubin: 0.5 mg/dL (ref 0.2–1.2)
Total Protein: 6.9 g/dL (ref 6.0–8.3)

## 2022-10-12 LAB — SEDIMENTATION RATE: Sed Rate: 10 mm/hr (ref 0–30)

## 2022-10-12 LAB — TSH: TSH: 1.7 u[IU]/mL (ref 0.35–5.50)

## 2022-10-12 NOTE — Patient Instructions (Addendum)
You will be contacted by [facility] within the next 1 week in regards to scheduling your CT abdomen/pelvis. If you miss their phone call, you may reach them at [phone], or you may contact our office at [phone]. ________________________________________________________   Your provider has requested that you have an abdominal x ray before leaving today. Please go to the basement floor to our Radiology department for the test. Your provider has requested that you go to the basement level for lab work before leaving today. Press "B" on the elevator. The lab is located at the first door on the left as you exit the elevator.  First do a trial off milk/lactose products if you use them.  Add fiber like benefiber or citracel once a day Increase activity Can do trial of IBGard which is over the counter for AB pain- Take 1-2 capsules once a day for maintence or twice a day during a flare Continue anti spasm medication, Bentyl, to take as needed Please try to decrease stress. consider talking with PCP about anti anxiety medication or try head space app for meditation. if any worsening symptoms like blood in stool, weight loss, please call the office   Please try low FODMAP diet- see below- start with eliminating just one column at a time, the table at the very bottom contains foods that are safe to take   FODMAP stands for fermentable oligo-, di-, mono-saccharides and polyols (1). These are the scientific terms used to classify groups of carbs that are notorious for triggering digestive symptoms like bloating, gas and stomach pain.   We may want to evaluate you for small intestinal bacterial overgrowth, this can cause increase gas, bloating, loose stools or constipation.  There is a test for this we can do or sometimes we will treat a patient with an antibiotic to see if it helps. Would try Xifaxin.

## 2022-10-13 NOTE — Progress Notes (Signed)
Noted  

## 2022-10-22 DIAGNOSIS — J3089 Other allergic rhinitis: Secondary | ICD-10-CM | POA: Diagnosis not present

## 2022-10-22 DIAGNOSIS — J301 Allergic rhinitis due to pollen: Secondary | ICD-10-CM | POA: Diagnosis not present

## 2022-10-25 ENCOUNTER — Ambulatory Visit (HOSPITAL_COMMUNITY)
Admission: RE | Admit: 2022-10-25 | Discharge: 2022-10-25 | Disposition: A | Discharge status: Home / Self Care | Payer: Federal, State, Local not specified - PPO | Source: Ambulatory Visit | Attending: Physician Assistant | Admitting: Physician Assistant

## 2022-10-25 ENCOUNTER — Encounter (HOSPITAL_COMMUNITY): Payer: Self-pay

## 2022-10-25 DIAGNOSIS — R1032 Left lower quadrant pain: Secondary | ICD-10-CM | POA: Insufficient documentation

## 2022-10-25 DIAGNOSIS — I700 Atherosclerosis of aorta: Secondary | ICD-10-CM | POA: Diagnosis not present

## 2022-10-25 DIAGNOSIS — K580 Irritable bowel syndrome with diarrhea: Secondary | ICD-10-CM | POA: Insufficient documentation

## 2022-10-25 DIAGNOSIS — R109 Unspecified abdominal pain: Secondary | ICD-10-CM | POA: Diagnosis not present

## 2022-10-25 MED ORDER — IOHEXOL 300 MG/ML  SOLN
100.0000 mL | Freq: Once | INTRAMUSCULAR | Status: AC | PRN
  Administered 2022-10-25: 100 mL via INTRAVENOUS

## 2022-10-25 MED ORDER — SODIUM CHLORIDE (PF) 0.9 % IJ SOLN
INTRAMUSCULAR | Status: AC
  Filled 2022-10-25: qty 50

## 2022-10-26 ENCOUNTER — Other Ambulatory Visit (HOSPITAL_COMMUNITY): Payer: Federal, State, Local not specified - PPO

## 2022-10-27 DIAGNOSIS — J3089 Other allergic rhinitis: Secondary | ICD-10-CM | POA: Diagnosis not present

## 2022-10-27 DIAGNOSIS — J301 Allergic rhinitis due to pollen: Secondary | ICD-10-CM | POA: Diagnosis not present

## 2022-11-19 DIAGNOSIS — J3089 Other allergic rhinitis: Secondary | ICD-10-CM | POA: Diagnosis not present

## 2022-11-19 DIAGNOSIS — J301 Allergic rhinitis due to pollen: Secondary | ICD-10-CM | POA: Diagnosis not present

## 2022-11-20 DIAGNOSIS — Z136 Encounter for screening for cardiovascular disorders: Secondary | ICD-10-CM | POA: Diagnosis not present

## 2022-11-20 DIAGNOSIS — Z79899 Other long term (current) drug therapy: Secondary | ICD-10-CM | POA: Diagnosis not present

## 2022-11-20 DIAGNOSIS — R7989 Other specified abnormal findings of blood chemistry: Secondary | ICD-10-CM | POA: Diagnosis not present

## 2022-12-03 DIAGNOSIS — Z1231 Encounter for screening mammogram for malignant neoplasm of breast: Secondary | ICD-10-CM | POA: Diagnosis not present

## 2022-12-04 NOTE — Progress Notes (Unsigned)
12/05/2022 Stephanie Hoover [id number] [birth year]  Referring provider: Vladimir Crofts, PA-C Primary GI doctor: Dr. Henrene Pastor  ASSESSMENT AND PLAN:   AB pain with history of colectomy due to diverticulitis CT with moderate burden, no acute process.  Colon 2020, recall 2025 Soft stools, incomplete Bms.  Had some improvement with miralax/fiber.  Continue fiber and IBGARD FODMAP, and lifestyle changes discussed Will do bowel purge, start on linzess.  [facility] to [facility] as patient lives in [place] [phone]) Follow up 3 months.  If continuing symptoms or any worsening symptoms will schedule for colonoscopy sooner.   Irritable bowel syndrome with diarrhea State long standing history Continue fiber, add linzess  Incontinence of feces, unspecified fecal incontinence type Worse since partial colectomy Due for colonoscopy 2025 Improved with fiber, Will refer to [facility] as patient lives in [place] [phone]).  Patient has had hysterectomy, kids, some urinary symptoms and likely pelvic floor symptoms.  Consider repeat colonoscopy sooner and anal manometry if not improved  Screen for colon cancer Due 2025  History of Present Illness:  66 y.o. female  with a past medical history of hypokalemia, hypomagnesia, history of adenomatous polyps IBS, complicated diverticular disease status post sigmoid colectomy 09/16/2019 and others listed below, returns to clinic today for evaluation of lower AB pain.  01/2019 colonoscopy recall 01/2024 Negative C. difficile and GI pathogen panel 2020 03/20/2021 CT abdomen pelvis with contrast without acute findings, noted to have diverticulosis and cholelithiasis. 11/01/2021 office visit with Dr. Henrene Pastor sporadic diarrhea and fecal incontinence-started on Metamucil, dicyclomine, avoid sugars 10/12/2022 office visit discussed LLQ pain, incontinence of feces discussed FODMAP, Xifaxan trial, rectal manometry. 10/25/2022 CT  abdomen pelvis with contrast for left lower quadrant abdominal pain history of partial colectomy showed no evidence of bowel obstruction, moderate stool burden within the colon, cholelithiasis without cholecystitis.  Add on fiber and miralax 1/2 capful a day, on IBGARD.  Has had rare fecal incontience, less frequent.  AB pain less frequent.  She had BM at 4 AM, then 30 mins later had to have BM again. Has been to bathroom 6 times since then.  No urgency or AB pain.  Normally small volume stools.  Very rare complete BM, has incomplete Bm's.  Her Appetite and bloating is better.   Denies nausea, vomiting, GERD. Some burping.  She has had black stool with pepto, none since being off last 2 weeks.  No weight loss.  She is retiring and lives [place], would prefer to places in [place].   Wt Readings from Last 3 Encounters:  12/05/22 134 lb (60.8 kg)  10/12/22 131 lb (59.4 kg)  11/01/21 129 lb 8 oz (58.7 kg)     She  reports that she has never smoked. She has never used smokeless tobacco. She reports current alcohol use. She reports that she does not use drugs. Her family history includes Gallbladder disease in her sister.   Current Medications:   Current Outpatient Medications (Endocrine & Metabolic):    estradiol (ESTRACE) 0.5 MG tablet, Take 0.5 mg by mouth daily.  Current Outpatient Medications (Cardiovascular):    atorvastatin (LIPITOR) 20 MG tablet, Take 1 tablet by mouth daily.  Current Outpatient Medications (Respiratory):    fluticasone (FLONASE) 50 MCG/ACT nasal spray, Place 1 spray into both nostrils daily.   loratadine (CLARITIN) 10 MG tablet, Take 10 mg by mouth daily.    Current Outpatient Medications (Other):    dicyclomine (BENTYL) 20 MG tablet, TAKE  1 TABLET (20 MG TOTAL) BY MOUTH EVERY 6 (SIX) HOURS AS NEEDED FOR SPASMS.   estradiol (ESTRACE) 0.1 MG/GM vaginal cream, Place 0.5 Applicatorfuls vaginally 2 (two) times a week.    hyoscyamine  (LEVSIN) 0.125 MG/5ML ELIX, Take 0.125 mg by mouth.   linaclotide (LINZESS) 145 MCG CAPS capsule, Take 1 capsule (145 mcg total) by mouth daily before breakfast. Exp: 06/2023, Lot: [id number]   linaclotide (LINZESS) 72 MCG capsule, Take 1 capsule (72 mcg total) by mouth daily before breakfast. Exp: 08-2023, [id number]   loperamide (IMODIUM) 2 MG capsule, Take 2 capsules (4 mg total) by mouth every 6 (six) hours as needed for diarrhea or loose stools.   Misc Natural Products (HEALTHY LIVER PO), Take 2 tablets by mouth daily.   polyethylene glycol (MIRALAX / GLYCOLAX) 17 g packet, Take 17 g by mouth daily. 1/2 scoop daily   Probiotic Product (PROBIOTIC DAILY PO), Take 1 capsule by mouth 2 (two) times daily.  Surgical History:  She  has a past surgical history that includes Appendectomy; Abdominal hysterectomy; Fracture surgery (Right); and Partial colectomy (2020).  Current Medications, Allergies, Past Medical History, Past Surgical History, Family History and Social History were reviewed in [employer] record.  Physical Exam: BP 110/64   Pulse 67   Ht 5' 0.5" (1.537 m)   Wt 134 lb (60.8 kg)   BMI 25.74 kg/m  General:   Pleasant, well developed female in no acute distress Heart : Regular rate and rhythm; no murmurs Pulm: Clear anteriorly; no wheezing Abdomen:  Soft, Obese AB, normal bowel sounds, non tender AB. With guarding and Without rebound, No organomegaly appreciated. Rectal: Not evaluated Extremities:  without  edema. Neurologic:  Alert and  oriented x4;  No focal deficits.  Psych:  Cooperative. Normal mood and affect.   Vladimir Crofts, PA-C 12/05/22

## 2022-12-05 ENCOUNTER — Ambulatory Visit: Payer: Federal, State, Local not specified - PPO | Admitting: Physician Assistant

## 2022-12-05 ENCOUNTER — Encounter: Payer: Self-pay | Admitting: Physician Assistant

## 2022-12-05 VITALS — BP 110/64 | HR 67 | Ht 60.5 in | Wt 134.0 lb

## 2022-12-05 DIAGNOSIS — K580 Irritable bowel syndrome with diarrhea: Secondary | ICD-10-CM | POA: Diagnosis not present

## 2022-12-05 DIAGNOSIS — R1032 Left lower quadrant pain: Secondary | ICD-10-CM | POA: Diagnosis not present

## 2022-12-05 DIAGNOSIS — R159 Full incontinence of feces: Secondary | ICD-10-CM

## 2022-12-05 MED ORDER — LINACLOTIDE 145 MCG PO CAPS: 145.0000 ug | ORAL_CAPSULE | Freq: Every day | ORAL | 0 refills | Status: DC

## 2022-12-05 MED ORDER — LINACLOTIDE 72 MCG PO CAPS: 72.0000 ug | ORAL_CAPSULE | Freq: Every day | ORAL | 0 refills | Status: DC

## 2022-12-05 NOTE — Patient Instructions (Addendum)
Please do the following: Purchase a bottle of Miralax over the counter as well as a box of 5 mg dulcolax tablets. Take 4 dulcolax tablets. Wait 1 hour. You will then drink 6-8 capfuls of Miralax mixed in an adequate amount of water/juice/gatorade (you may choose which of these liquids to drink) over the next 2-3 hours. You should expect results within 1 to 6 hours after completing the bowel purge. Go to the er if you have severe AB pain, can not pass gas or stool in over 12 hours, can not hold down any food.   Linzess start 72 and increase to 145 if you want Take with the fiber If this does well call and let us know which dose, if no difference go back to the miralax.   *IBS-C patients may begin to experience relief from belly pain and overall abdominal symptoms (pain, discomfort, and bloating) in about 1 week,  with symptoms typically improving over 12 weeks.  Take at least 30 minutes before the first meal of the day on an empty stomach You can have a loose stool if you eat a high-fat breakfast. Give it at least 7 days, may have more bowel movements during that time.   The diarrhea should go away and you should start having normal, complete, full bowel movements.  It may be helpful to start treatment when you can be near the comfort of your own bathroom, such as a weekend.  After you are out we can send in a prescription if you did well, there is a prescription card  Here some information about pelvic floor dysfunction. This may be contributing to some of your symptoms. We will continue with our evaluation but I do want you to consider adding on fiber supplement with low-dose MiraLAX daily. We could also refer to pelvic floor physical therapy.   Pelvic Floor Dysfunction, Female Pelvic floor dysfunction (PFD) is a condition that results when the group of muscles and connective tissues that support the organs in the pelvis (pelvic floor muscles) do not work well. These muscles and their  connections form a sling that supports the colon and bladder. In women, they also support the uterus. PFD causes pelvic floor muscles to be too weak, too tight, or both. In PFD, muscle movements are not coordinated. This may cause bowel or bladder problems. It may also cause pain. What are the causes? This condition may be caused by an injury to the pelvic area or by a weakening of pelvic muscles. This often results from pregnancy and childbirth or other types of strain. In many cases, the exact cause is not known. What increases the risk? The following factors may make you more likely to develop this condition: Having chronic bladder tissue inflammation (interstitial cystitis). Being an older person. Being overweight. History of radiation treatment for cancer in the pelvic region. Previous pelvic surgery, such as removal of the uterus (hysterectomy). What are the signs or symptoms? Symptoms of this condition vary and may include: Bladder symptoms, such as: Trouble starting urination and emptying the bladder. Frequent urinary tract infections. Leaking urine when coughing, laughing, or exercising (stress incontinence). Having to pass urine urgently or frequently. Pain when passing urine. Bowel symptoms, such as: Constipation. Urgent or frequent bowel movements. Incomplete bowel movements. Painful bowel movements. Leaking stool or gas. Unexplained genital or rectal pain. Genital or rectal muscle spasms. Low back pain. Other symptoms may include: A heavy, full, or aching feeling in the vagina. A bulge that protrudes into the  vagina. Pain during or after sex. How is this diagnosed? This condition may be diagnosed based on: Your symptoms and medical history. A physical exam. During the exam, your health care provider may check your pelvic muscles for tightness, spasm, pain, or weakness. This may include a rectal exam and a pelvic exam. In some cases, you may have diagnostic tests, such  as: Electrical muscle function tests. Urine flow testing. X-ray tests of bowel function. Ultrasound of the pelvic organs. How is this treated? Treatment for this condition depends on the symptoms. Treatment options include: Physical therapy. This may include Kegel exercises to help relax or strengthen the pelvic floor muscles. Biofeedback. This type of therapy provides feedback on how tight your pelvic floor muscles are so that you can learn to control them. Internal or external massage therapy. A treatment that involves electrical stimulation of the pelvic floor muscles to help control pain (transcutaneous electrical nerve stimulation, or TENS). Sound wave therapy (ultrasound) to reduce muscle spasms. Medicines, such as: Muscle relaxants. Bladder control medicines. Surgery to reconstruct or support pelvic floor muscles may be an option if other treatments do not help. Follow these instructions at home: Activity Do your usual activities as told by your health care provider. Ask your health care provider if you should modify any activities. Do pelvic floor strengthening or relaxing exercises at home as told by your physical therapist. Lifestyle Maintain a healthy weight. Eat foods that are high in fiber, such as beans, whole grains, and fresh fruits and vegetables. Limit foods that are high in fat and processed sugars, such as fried or sweet foods. Manage stress with relaxation techniques such as yoga or meditation. General instructions If you have problems with leakage: Use absorbable pads or wear padded underwear. Wash frequently with mild soap. Keep your genital and anal area as clean and dry as possible. Ask your health care provider if you should try a barrier cream to prevent skin irritation. Take warm baths to relieve pelvic muscle tension or spasms. Take over-the-counter and prescription medicines only as told by your health care provider. Keep all follow-up visits. How is  this prevented? The cause of PFD is not always known, but there are a few things you can do to reduce the risk of developing this condition, including: Staying at a healthy weight. Getting regular exercise. Managing stress. Contact a health care provider if: Your symptoms are not improving with home care. You have signs or symptoms of PFD that get worse at home. You develop new signs or symptoms. You have signs of a urinary tract infection, such as: Fever. Chills. Increased urinary frequency. A burning feeling when urinating. You have not had a bowel movement in 3 days (constipation). Summary Pelvic floor dysfunction results when the muscles and connective tissues in your pelvic floor do not work well. These muscles and their connections form a sling that supports your colon and bladder. In women, they also support the uterus. PFD may be caused by an injury to the pelvic area or by a weakening of pelvic muscles. PFD causes pelvic floor muscles to be too weak, too tight, or a combination of both. Symptoms may vary from person to person. In most cases, PFD can be treated with physical therapies and medicines. Surgery may be an option if other treatments do not help. This information is not intended to replace advice given to you by your health care provider. Make sure you discuss any questions you have with your health care provider. Document Revised:  04/12/2021 Document Reviewed: 04/12/2021 Elsevier Patient Education  [place].   We are referring you to [facility].  They will contact you directly to schedule an appointment.  It may take a week or more before you hear from them.  Please feel free to contact us if you have not heard from them within 2 weeks and we will follow up on the referral.    Please follow up in March.  You can call next month to schedule:  [phone]  Thank you for entrusting me with your care and for choosing [facility], Vicie Mutters, P.A.-C

## 2022-12-07 NOTE — Progress Notes (Signed)
Assessment and plans noted ?

## 2022-12-14 DIAGNOSIS — R519 Headache, unspecified: Secondary | ICD-10-CM | POA: Diagnosis not present

## 2022-12-14 DIAGNOSIS — Z20822 Contact with and (suspected) exposure to covid-19: Secondary | ICD-10-CM | POA: Diagnosis not present

## 2022-12-14 DIAGNOSIS — R0981 Nasal congestion: Secondary | ICD-10-CM | POA: Diagnosis not present

## 2022-12-14 DIAGNOSIS — R051 Acute cough: Secondary | ICD-10-CM | POA: Diagnosis not present

## 2022-12-18 DIAGNOSIS — J3089 Other allergic rhinitis: Secondary | ICD-10-CM | POA: Diagnosis not present

## 2022-12-18 DIAGNOSIS — J301 Allergic rhinitis due to pollen: Secondary | ICD-10-CM | POA: Diagnosis not present

## 2022-12-21 ENCOUNTER — Telehealth: Payer: Self-pay | Admitting: Physician Assistant

## 2022-12-21 NOTE — Telephone Encounter (Signed)
Patient called states she is doing well with the Linzess and requested a script to be sent over to [facility] [phone]

## 2022-12-21 NOTE — Telephone Encounter (Signed)
Patient was given samples of Linzess 72 AND 145 mcgs.  LM for patient to call back to let us know which strength she would like a script for.

## 2022-12-24 MED ORDER — LINACLOTIDE 72 MCG PO CAPS: 72.0000 ug | ORAL_CAPSULE | Freq: Every day | ORAL | 3 refills | Status: AC

## 2022-12-24 NOTE — Telephone Encounter (Signed)
Patient is calling states she wishes for the  Linzess 72 mcgs. Please advise

## 2022-12-24 NOTE — Telephone Encounter (Signed)
Refill of Linzess 72 mcgs sent to pharmacy

## 2022-12-24 NOTE — Addendum Note (Signed)
Addended by: Roetta Sessions on: 12/24/2022 03:33 PM   Modules accepted: Orders

## 2022-12-25 ENCOUNTER — Other Ambulatory Visit (HOSPITAL_COMMUNITY): Payer: Self-pay

## 2022-12-25 ENCOUNTER — Telehealth: Payer: Self-pay | Admitting: Pharmacy Technician

## 2022-12-25 NOTE — Telephone Encounter (Signed)
Patient Advocate Encounter  Received notification from [facility] that prior authorization for [facility] is required.   PA submitted on 1.9.24 Key [id number]  Status is pending

## 2022-12-27 ENCOUNTER — Other Ambulatory Visit (HOSPITAL_COMMUNITY): Payer: Self-pay

## 2022-12-27 NOTE — Telephone Encounter (Signed)
Patient Advocate Encounter  Prior Authorization for [facility] has been approved.    PA# --- Effective dates: 12.10.23 through 1.8.25

## 2023-01-16 DIAGNOSIS — J3089 Other allergic rhinitis: Secondary | ICD-10-CM | POA: Diagnosis not present

## 2023-01-16 DIAGNOSIS — J301 Allergic rhinitis due to pollen: Secondary | ICD-10-CM | POA: Diagnosis not present

## 2024-03-18 ENCOUNTER — Encounter: Payer: Self-pay | Admitting: Internal Medicine
